# Patient Record
Sex: Female | Born: 2000 | Race: White | Hispanic: No | Marital: Single | Smoking: Never smoker
Health system: Southern US, Community
[De-identification: ages and names within clinical notes are randomized; demographics above are authoritative.]

## PROBLEM LIST (undated history)

## (undated) DIAGNOSIS — G90A Postural orthostatic tachycardia syndrome (POTS): Secondary | ICD-10-CM

## (undated) DIAGNOSIS — J45909 Unspecified asthma, uncomplicated: Secondary | ICD-10-CM

## (undated) HISTORY — DX: Unspecified asthma, uncomplicated: J45.909

## (undated) HISTORY — PX: ADENOIDECTOMY: SUR15

## (undated) HISTORY — PX: SINOSCOPY: SHX187

## (undated) HISTORY — PX: TYMPANOSTOMY TUBE PLACEMENT: SHX32

---
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
## 2021-01-23 ENCOUNTER — Ambulatory Visit (INDEPENDENT_AMBULATORY_CARE_PROVIDER_SITE_OTHER): Payer: 59

## 2021-01-23 DIAGNOSIS — J309 Allergic rhinitis, unspecified: Secondary | ICD-10-CM | POA: Diagnosis not present

## 2021-02-15 ENCOUNTER — Telehealth: Payer: Self-pay

## 2021-02-15 NOTE — Telephone Encounter (Signed)
Patient's cousin Gilman Schmidt came in to pick up vials. Due to no documentation regarding vials being picked up I had Tobi Bastos call patient. Patient stated that she spoke with someone on Wednesday and was informed that it would be ok. Patient is here for school and receives injections at our office from an outside office, patient is going back home to [place] for break and will take vials back to Allergist there to continue injections until she comes back to school.

## 2021-04-04 ENCOUNTER — Other Ambulatory Visit: Payer: Self-pay

## 2021-04-04 ENCOUNTER — Ambulatory Visit (HOSPITAL_COMMUNITY)
Admission: EM | Admit: 2021-04-04 | Discharge: 2021-04-04 | Disposition: A | Discharge status: Home / Self Care | Payer: 59 | Attending: Family Medicine | Admitting: Family Medicine

## 2021-04-04 DIAGNOSIS — R109 Unspecified abdominal pain: Secondary | ICD-10-CM

## 2021-04-04 LAB — POCT URINALYSIS DIPSTICK, ED / UC
Bilirubin Urine: NEGATIVE
Glucose, UA: NEGATIVE mg/dL
Ketones, ur: NEGATIVE mg/dL
Nitrite: NEGATIVE
Protein, ur: NEGATIVE mg/dL
Specific Gravity, Urine: 1.025 (ref 1.005–1.030)
Urobilinogen, UA: 0.2 mg/dL (ref 0.0–1.0)
pH: 7 (ref 5.0–8.0)

## 2021-04-04 LAB — POC URINE PREG, ED: Preg Test, Ur: NEGATIVE

## 2021-04-04 NOTE — ED Triage Notes (Signed)
Pt presents to urgent care for left side abdominal pain x 3 days.

## 2021-04-05 NOTE — ED Provider Notes (Signed)
[facility]    CSN: [id number] Arrival date & time: 04/04/21  1918      History   Chief Complaint Chief Complaint  Patient presents with   Abdominal Pain    HPI Tracey Harrison is a 21 y.o. female. She reports abdominal pain for the last 3 days. Mostly it is LLQ but she reports the pain "moves around" and is sometimes in RLQ, LUQ, L shoulder or L back. Pain is sharp, will abruptly onset, last for a few seconds and then recede. She is in the process of a workup for endometriosis and has a pelvic US scheduled next week. Reports nausea but no vomiting. Denies fever or chills. LMP was last week, no vaginal bleeding or discharge now. Had similar symptoms previously but they were limited to RLQ and turned out to be herniated discs. No numbness, weakness, tingling.    Abdominal Pain Associated symptoms: diarrhea and nausea   Associated symptoms: no chills, no dysuria, no fever, no vaginal bleeding, no vaginal discharge and no vomiting    Past Medical History:  Diagnosis Date   Asthma     Patient Active Problem List   Diagnosis Date Noted   Mild intermittent asthma without complication 10/16/2020   Other allergic rhinitis 10/16/2020   Allergic conjunctivitis of both eyes 10/16/2020   Other adverse food reactions, not elsewhere classified, subsequent encounter 10/16/2020   History of eczema as a child 10/16/2020   History of urticaria 10/16/2020    Past Surgical History:  Procedure Laterality Date   ADENOIDECTOMY     SINOSCOPY     TYMPANOSTOMY TUBE PLACEMENT      OB History   No obstetric history on file.      Home Medications    Prior to Admission medications   Medication Sig Start Date End Date Taking? Authorizing Provider  albuterol (VENTOLIN HFA) 108 (90 Base) MCG/ACT inhaler Inhale 2 puffs into the lungs every 4 (four) hours as needed for wheezing or shortness of breath (coughing fits). 10/15/20   Ellamae Sia, DO  cetirizine (ZYRTEC) 10 MG tablet Take 10 mg  by mouth daily.    [provider]  EPINEPHrine 0.3 mg/0.3 mL IJ SOAJ injection INJECT 1 PEN INTO THE MUSCLE AS NEEDED ANAPHYLAXIS 10/04/19   [provider]  fluticasone (FLONASE) 50 MCG/ACT nasal spray Place into the nose. 07/23/19   [provider]  hydrOXYzine (ATARAX/VISTARIL) 50 MG tablet Take by mouth. 05/05/19   [provider]  meloxicam (MOBIC) 15 MG tablet Take by mouth. 10/10/19   [provider]  montelukast (SINGULAIR) 10 MG tablet Take by mouth.    [provider]  naproxen (NAPROSYN) 500 MG tablet  07/23/19   [provider]  ondansetron (ZOFRAN-ODT) 4 MG disintegrating tablet Take by mouth. 01/22/17   [provider]  sertraline (ZOLOFT) 100 MG tablet Take by mouth. 04/13/19   [provider]  tiZANidine (ZANAFLEX) 4 MG tablet Take by mouth. 10/24/19   [provider]  tranexamic acid (LYSTEDA) 650 MG TABS tablet Take by mouth. 04/21/19   [provider]    Family History No family history on file.  Social History Social History   Tobacco Use   Smoking status: Never   Smokeless tobacco: Never  Vaping Use   Vaping Use: Never used     Allergies   Monosodium glutamate, Influenza vaccines, Penicillins, Rice, Varicella virus vaccine live, Lactase-lactobacillus, and Pollen extract   Review of Systems Review of Systems  Constitutional:  Negative for chills and fever.  Gastrointestinal:  Positive for abdominal pain, diarrhea and nausea. Negative for blood in stool and vomiting.  Genitourinary:  Negative for dysuria, flank pain, vaginal bleeding and vaginal discharge.    Physical Exam Triage Vital Signs ED Triage Vitals [04/04/21 1943]  Enc Vitals Group     BP 116/84     Pulse Rate 95     Resp 16     Temp 98.9 F (37.2 C)     Temp Source Oral     SpO2 95 %     Weight      Height      Head Circumference      Peak Flow      Pain Score 7     Pain Loc      Pain Edu?       Excl. in GC?    No data found.  Updated Vital Signs BP 116/84 (BP Location: Left Arm)    Pulse 95    Temp 98.9 F (37.2 C) (Oral)    Resp 16    SpO2 95%   Visual Acuity Right Eye Distance:   Left Eye Distance:   Bilateral Distance:    Right Eye Near:   Left Eye Near:    Bilateral Near:     Physical Exam Constitutional:      General: She is not in acute distress.    Appearance: She is well-developed. She is not ill-appearing.  Cardiovascular:     Rate and Rhythm: Normal rate and regular rhythm.  Pulmonary:     Effort: Pulmonary effort is normal.     Breath sounds: Normal breath sounds.  Abdominal:     General: Abdomen is flat.     Tenderness: There is abdominal tenderness in the right lower quadrant, suprapubic area, left upper quadrant and left lower quadrant. There is no right CVA tenderness, left CVA tenderness, guarding or rebound.  Neurological:     Mental Status: She is alert.     UC Treatments / Results  Labs (all labs ordered are listed, but only abnormal results are displayed) Labs Reviewed  POCT URINALYSIS DIPSTICK, ED / UC - Abnormal; Notable for the following components:      Result Value   Hgb urine dipstick TRACE (*)    Leukocytes,Ua TRACE (*)    All other components within normal limits  URINE CULTURE  POC URINE PREG, ED    EKG   Radiology No results found.  Procedures Procedures (including critical care time)  Medications Ordered in UC Medications - No data to display  Initial Impression / Assessment and Plan / UC Course  I have reviewed the triage vital signs and the nursing notes.  Pertinent labs & imaging results that were available during my care of the patient were reviewed by me and considered in my medical decision making (see chart for details).    Pt most comfortable in supine position with legs hanging off end of exam table. No evidence for acute abd at this time. Reviewed red flags with pt and reasons for seeking ER care.  Continue to f/u with gyn and follow through with endometriosis work up  Final Clinical Impressions(s) / UC Diagnoses   Final diagnoses:  Abdominal pain, unspecified abdominal location   Discharge Instructions   None    ED Prescriptions   None    PDMP not reviewed this encounter.   Cathlyn Parsons, NP 04/05/21 1055

## 2021-04-06 LAB — URINE CULTURE

## 2021-04-09 ENCOUNTER — Ambulatory Visit: Payer: Self-pay

## 2021-04-09 NOTE — Progress Notes (Signed)
Patient is going to reach out to her allergist and have them mailed more vials.

## 2021-06-06 ENCOUNTER — Encounter (HOSPITAL_COMMUNITY)
Admission: EM | Disposition: A | Discharge status: Home / Self Care | Payer: Self-pay | Source: Home / Self Care | Attending: Emergency Medicine

## 2021-06-06 ENCOUNTER — Emergency Department (HOSPITAL_COMMUNITY): Payer: 59

## 2021-06-06 ENCOUNTER — Other Ambulatory Visit: Payer: Self-pay

## 2021-06-06 ENCOUNTER — Emergency Department (HOSPITAL_BASED_OUTPATIENT_CLINIC_OR_DEPARTMENT_OTHER): Payer: 59 | Admitting: Anesthesiology

## 2021-06-06 ENCOUNTER — Encounter (HOSPITAL_COMMUNITY): Payer: Self-pay | Admitting: Emergency Medicine

## 2021-06-06 ENCOUNTER — Observation Stay (HOSPITAL_COMMUNITY)
Admission: EM | Admit: 2021-06-06 | Discharge: 2021-06-07 | Disposition: A | Discharge status: Home / Self Care | Payer: 59 | Attending: Emergency Medicine | Admitting: Emergency Medicine

## 2021-06-06 ENCOUNTER — Emergency Department (HOSPITAL_COMMUNITY): Payer: 59 | Admitting: Anesthesiology

## 2021-06-06 DIAGNOSIS — K3580 Unspecified acute appendicitis: Secondary | ICD-10-CM | POA: Diagnosis present

## 2021-06-06 DIAGNOSIS — R1031 Right lower quadrant pain: Secondary | ICD-10-CM | POA: Diagnosis present

## 2021-06-06 DIAGNOSIS — J45909 Unspecified asthma, uncomplicated: Secondary | ICD-10-CM | POA: Diagnosis not present

## 2021-06-06 DIAGNOSIS — K3589 Other acute appendicitis without perforation or gangrene: Principal | ICD-10-CM | POA: Insufficient documentation

## 2021-06-06 DIAGNOSIS — Z9049 Acquired absence of other specified parts of digestive tract: Secondary | ICD-10-CM

## 2021-06-06 HISTORY — PX: LAPAROSCOPIC APPENDECTOMY: SHX408

## 2021-06-06 HISTORY — DX: Postural orthostatic tachycardia syndrome (POTS): G90.A

## 2021-06-06 LAB — CBC
HCT: 42 % (ref 36.0–46.0)
Hemoglobin: 14 g/dL (ref 12.0–15.0)
MCH: 28.1 pg (ref 26.0–34.0)
MCHC: 33.3 g/dL (ref 30.0–36.0)
MCV: 84.3 fL (ref 80.0–100.0)
Platelets: 350 10*3/uL (ref 150–400)
RBC: 4.98 MIL/uL (ref 3.87–5.11)
RDW: 12.5 % (ref 11.5–15.5)
WBC: 18.1 10*3/uL — ABNORMAL HIGH (ref 4.0–10.5)
nRBC: 0 % (ref 0.0–0.2)

## 2021-06-06 LAB — URINALYSIS, ROUTINE W REFLEX MICROSCOPIC
Bilirubin Urine: NEGATIVE
Glucose, UA: NEGATIVE mg/dL
Ketones, ur: 5 mg/dL — AB
Nitrite: NEGATIVE
Protein, ur: NEGATIVE mg/dL
Specific Gravity, Urine: 1.027 (ref 1.005–1.030)
pH: 6 (ref 5.0–8.0)

## 2021-06-06 LAB — HIV ANTIBODY (ROUTINE TESTING W REFLEX): HIV Screen 4th Generation wRfx: NONREACTIVE

## 2021-06-06 LAB — COMPREHENSIVE METABOLIC PANEL
ALT: 23 U/L (ref 0–44)
AST: 22 U/L (ref 15–41)
Albumin: 3.9 g/dL (ref 3.5–5.0)
Alkaline Phosphatase: 49 U/L (ref 38–126)
Anion gap: 11 (ref 5–15)
BUN: 9 mg/dL (ref 6–20)
CO2: 20 mmol/L — ABNORMAL LOW (ref 22–32)
Calcium: 9.4 mg/dL (ref 8.9–10.3)
Chloride: 104 mmol/L (ref 98–111)
Creatinine, Ser: 0.87 mg/dL (ref 0.44–1.00)
GFR, Estimated: >60 mL/min (ref >60)
Glucose, Bld: 153 mg/dL — ABNORMAL HIGH (ref 70–99)
Potassium: 3.3 mmol/L — ABNORMAL LOW (ref 3.5–5.1)
Sodium: 135 mmol/L (ref 135–145)
Total Bilirubin: 0.7 mg/dL (ref 0.3–1.2)
Total Protein: 6.8 g/dL (ref 6.5–8.1)

## 2021-06-06 LAB — I-STAT BETA HCG BLOOD, ED (MC, WL, AP ONLY): I-stat hCG, quantitative: <5 m[IU]/mL (ref <5)

## 2021-06-06 LAB — LIPASE, BLOOD: Lipase: 30 U/L (ref 11–51)

## 2021-06-06 SURGERY — APPENDECTOMY, LAPAROSCOPIC
Anesthesia: General

## 2021-06-06 MED ORDER — DIPHENHYDRAMINE HCL 50 MG/ML IJ SOLN: 25.0000 mg | Freq: Four times a day (QID) | INTRAMUSCULAR | Status: DC | PRN

## 2021-06-06 MED ORDER — FENTANYL CITRATE (PF) 250 MCG/5ML IJ SOLN
INTRAMUSCULAR | Status: AC
Start: 2021-06-06 — End: ?
  Filled 2021-06-06: qty 5

## 2021-06-06 MED ORDER — PHENYLEPHRINE 40 MCG/ML (10ML) SYRINGE FOR IV PUSH (FOR BLOOD PRESSURE SUPPORT)
PREFILLED_SYRINGE | INTRAVENOUS | Status: DC | PRN
Start: 2021-06-06 — End: 2021-06-06
  Administered 2021-06-06: 120 ug via INTRAVENOUS
  Administered 2021-06-06: 80 ug via INTRAVENOUS

## 2021-06-06 MED ORDER — IOHEXOL 300 MG/ML  SOLN
100.0000 mL | Freq: Once | INTRAMUSCULAR | Status: AC | PRN
  Administered 2021-06-06: 100 mL via INTRAVENOUS

## 2021-06-06 MED ORDER — ROCURONIUM BROMIDE 10 MG/ML (PF) SYRINGE
PREFILLED_SYRINGE | INTRAVENOUS | Status: DC | PRN
Start: 2021-06-06 — End: 2021-06-06
  Administered 2021-06-06: 50 mg via INTRAVENOUS

## 2021-06-06 MED ORDER — POLYETHYLENE GLYCOL 3350 17 G PO PACK: 17.0000 g | PACK | Freq: Every day | ORAL | Status: DC | PRN

## 2021-06-06 MED ORDER — ONDANSETRON HCL 4 MG/2ML IJ SOLN
4.0000 mg | Freq: Once | INTRAMUSCULAR | Status: AC | PRN
  Administered 2021-06-06: 4 mg via INTRAVENOUS
  Filled 2021-06-06: qty 2

## 2021-06-06 MED ORDER — SODIUM CHLORIDE 0.9 % IR SOLN
Status: DC | PRN
  Administered 2021-06-06: 1000 mL

## 2021-06-06 MED ORDER — GLYCOPYRROLATE 0.2 MG/ML IJ SOLN
INTRAMUSCULAR | Status: DC | PRN
  Administered 2021-06-06: .1 mg via INTRAVENOUS

## 2021-06-06 MED ORDER — ONDANSETRON HCL 4 MG/2ML IJ SOLN
INTRAMUSCULAR | Status: DC | PRN
  Administered 2021-06-06: 4 mg via INTRAVENOUS

## 2021-06-06 MED ORDER — MORPHINE SULFATE (PF) 4 MG/ML IV SOLN
4.0000 mg | Freq: Once | INTRAVENOUS | Status: AC
  Administered 2021-06-06: 4 mg via INTRAVENOUS
  Filled 2021-06-06: qty 1

## 2021-06-06 MED ORDER — CHLORHEXIDINE GLUCONATE 0.12 % MT SOLN
OROMUCOSAL | Status: AC
  Administered 2021-06-06: 15 mL via OROMUCOSAL
  Filled 2021-06-06: qty 15

## 2021-06-06 MED ORDER — DEXMEDETOMIDINE (PRECEDEX) IN NS 20 MCG/5ML (4 MCG/ML) IV SYRINGE
PREFILLED_SYRINGE | INTRAVENOUS | Status: DC | PRN
  Administered 2021-06-06: 20 ug via INTRAVENOUS

## 2021-06-06 MED ORDER — PROPOFOL 10 MG/ML IV BOLUS
INTRAVENOUS | Status: DC | PRN
  Administered 2021-06-06: 160 mg via INTRAVENOUS

## 2021-06-06 MED ORDER — ONDANSETRON HCL 4 MG/2ML IJ SOLN: 4.0000 mg | Freq: Four times a day (QID) | INTRAMUSCULAR | Status: DC | PRN

## 2021-06-06 MED ORDER — SUCCINYLCHOLINE CHLORIDE 200 MG/10ML IV SOSY
PREFILLED_SYRINGE | INTRAVENOUS | Status: DC | PRN
  Administered 2021-06-06: 100 mg via INTRAVENOUS

## 2021-06-06 MED ORDER — DIPHENHYDRAMINE HCL 25 MG PO CAPS: 25.0000 mg | ORAL_CAPSULE | Freq: Four times a day (QID) | ORAL | Status: DC | PRN

## 2021-06-06 MED ORDER — PHENYLEPHRINE HCL-NACL 20-0.9 MG/250ML-% IV SOLN
INTRAVENOUS | Status: DC | PRN
  Administered 2021-06-06: 40 ug/min via INTRAVENOUS

## 2021-06-06 MED ORDER — DOCUSATE SODIUM 100 MG PO CAPS
100.0000 mg | ORAL_CAPSULE | Freq: Two times a day (BID) | ORAL | Status: DC
  Administered 2021-06-06: 100 mg via ORAL
  Filled 2021-06-06 (×2): qty 1

## 2021-06-06 MED ORDER — MORPHINE SULFATE (PF) 2 MG/ML IV SOLN: 2.0000 mg | INTRAVENOUS | Status: DC | PRN

## 2021-06-06 MED ORDER — ONDANSETRON 4 MG PO TBDP: 4.0000 mg | ORAL_TABLET | Freq: Four times a day (QID) | ORAL | Status: DC | PRN

## 2021-06-06 MED ORDER — HYDRALAZINE HCL 20 MG/ML IJ SOLN: 10.0000 mg | INTRAMUSCULAR | Status: DC | PRN

## 2021-06-06 MED ORDER — LEVOCETIRIZINE DIHYDROCHLORIDE 5 MG PO TABS: 5.0000 mg | ORAL_TABLET | Freq: Every evening | ORAL | Status: DC

## 2021-06-06 MED ORDER — FLUTICASONE PROPIONATE 50 MCG/ACT NA SUSP
2.0000 | Freq: Every day | NASAL | Status: DC
  Administered 2021-06-07: 2 via NASAL
  Filled 2021-06-06: qty 16

## 2021-06-06 MED ORDER — MIDAZOLAM HCL 2 MG/2ML IJ SOLN
INTRAMUSCULAR | Status: AC
  Filled 2021-06-06: qty 2

## 2021-06-06 MED ORDER — POTASSIUM CHLORIDE IN NACL 20-0.9 MEQ/L-% IV SOLN
INTRAVENOUS | Status: DC
Start: 2021-06-06 — End: 2021-06-07
  Filled 2021-06-06 (×3): qty 1000

## 2021-06-06 MED ORDER — OXYCODONE HCL 5 MG PO TABS
5.0000 mg | ORAL_TABLET | ORAL | Status: DC | PRN
  Administered 2021-06-06 – 2021-06-07 (×4): 10 mg via ORAL
  Filled 2021-06-06 (×4): qty 2

## 2021-06-06 MED ORDER — METHOCARBAMOL 500 MG PO TABS
500.0000 mg | ORAL_TABLET | Freq: Four times a day (QID) | ORAL | Status: DC | PRN
  Filled 2021-06-06: qty 1

## 2021-06-06 MED ORDER — BUPIVACAINE HCL (PF) 0.25 % IJ SOLN
INTRAMUSCULAR | Status: DC | PRN
  Administered 2021-06-06: 20 mL

## 2021-06-06 MED ORDER — SERTRALINE HCL 100 MG PO TABS
100.0000 mg | ORAL_TABLET | Freq: Every day | ORAL | Status: DC
  Administered 2021-06-06 – 2021-06-07 (×2): 100 mg via ORAL
  Filled 2021-06-06 (×2): qty 1

## 2021-06-06 MED ORDER — MELATONIN 3 MG PO TABS: 3.0000 mg | ORAL_TABLET | Freq: Every evening | ORAL | Status: DC | PRN

## 2021-06-06 MED ORDER — SODIUM CHLORIDE 0.9 % IV SOLN: INTRAVENOUS | Status: DC

## 2021-06-06 MED ORDER — CIPROFLOXACIN IN D5W 400 MG/200ML IV SOLN
400.0000 mg | INTRAVENOUS | Status: AC
  Administered 2021-06-06: 400 mg via INTRAVENOUS
  Filled 2021-06-06: qty 200

## 2021-06-06 MED ORDER — FENTANYL CITRATE (PF) 250 MCG/5ML IJ SOLN
INTRAMUSCULAR | Status: DC | PRN
  Administered 2021-06-06: 100 ug via INTRAVENOUS
  Administered 2021-06-06: 50 ug via INTRAVENOUS

## 2021-06-06 MED ORDER — ACETAMINOPHEN 500 MG PO TABS
1000.0000 mg | ORAL_TABLET | ORAL | Status: AC
  Administered 2021-06-06: 1000 mg via ORAL
  Filled 2021-06-06: qty 2

## 2021-06-06 MED ORDER — MORPHINE SULFATE (PF) 2 MG/ML IV SOLN
2.0000 mg | INTRAVENOUS | Status: DC | PRN
  Administered 2021-06-06: 2 mg via INTRAVENOUS
  Filled 2021-06-06: qty 1

## 2021-06-06 MED ORDER — LACTATED RINGERS IV SOLN: INTRAVENOUS | Status: DC

## 2021-06-06 MED ORDER — FAMOTIDINE 20 MG PO TABS
40.0000 mg | ORAL_TABLET | Freq: Two times a day (BID) | ORAL | Status: DC
  Administered 2021-06-06 – 2021-06-07 (×2): 40 mg via ORAL
  Filled 2021-06-06 (×2): qty 2

## 2021-06-06 MED ORDER — PROCHLORPERAZINE MALEATE 10 MG PO TABS
10.0000 mg | ORAL_TABLET | Freq: Four times a day (QID) | ORAL | Status: DC | PRN
  Filled 2021-06-06: qty 1

## 2021-06-06 MED ORDER — POTASSIUM CHLORIDE 10 MEQ/100ML IV SOLN
10.0000 meq | INTRAVENOUS | Status: AC
  Administered 2021-06-06 (×2): 10 meq via INTRAVENOUS
  Filled 2021-06-06 (×2): qty 100

## 2021-06-06 MED ORDER — LORATADINE 10 MG PO TABS
10.0000 mg | ORAL_TABLET | Freq: Every day | ORAL | Status: DC
Start: 2021-06-06 — End: 2021-06-07
  Administered 2021-06-06 – 2021-06-07 (×2): 10 mg via ORAL
  Filled 2021-06-06 (×2): qty 1

## 2021-06-06 MED ORDER — ORAL CARE MOUTH RINSE: 15.0000 mL | Freq: Once | OROMUCOSAL | Status: AC

## 2021-06-06 MED ORDER — SIMETHICONE 80 MG PO CHEW
40.0000 mg | CHEWABLE_TABLET | Freq: Four times a day (QID) | ORAL | Status: DC | PRN
Start: 2021-06-06 — End: 2021-06-07

## 2021-06-06 MED ORDER — ACETAMINOPHEN 500 MG PO TABS
1000.0000 mg | ORAL_TABLET | Freq: Three times a day (TID) | ORAL | Status: DC
Start: 2021-06-06 — End: 2021-06-07
  Administered 2021-06-06 – 2021-06-07 (×2): 1000 mg via ORAL
  Filled 2021-06-06 (×2): qty 2

## 2021-06-06 MED ORDER — SODIUM CHLORIDE 0.9 % IV BOLUS
1000.0000 mL | Freq: Once | INTRAVENOUS | Status: AC
  Administered 2021-06-06: 1000 mL via INTRAVENOUS

## 2021-06-06 MED ORDER — DEXAMETHASONE SODIUM PHOSPHATE 10 MG/ML IJ SOLN
INTRAMUSCULAR | Status: DC | PRN
Start: 2021-06-06 — End: 2021-06-06
  Administered 2021-06-06: 10 mg via INTRAVENOUS

## 2021-06-06 MED ORDER — BUPIVACAINE HCL (PF) 0.25 % IJ SOLN
INTRAMUSCULAR | Status: AC
  Filled 2021-06-06: qty 30

## 2021-06-06 MED ORDER — SUGAMMADEX SODIUM 200 MG/2ML IV SOLN
INTRAVENOUS | Status: DC | PRN
  Administered 2021-06-06: 400 mg via INTRAVENOUS

## 2021-06-06 MED ORDER — 0.9 % SODIUM CHLORIDE (POUR BTL) OPTIME
TOPICAL | Status: DC | PRN
  Administered 2021-06-06: 1000 mL

## 2021-06-06 MED ORDER — METRONIDAZOLE 500 MG/100ML IV SOLN
500.0000 mg | INTRAVENOUS | Status: AC
  Administered 2021-06-06: 500 mg via INTRAVENOUS
  Filled 2021-06-06: qty 100

## 2021-06-06 MED ORDER — CHLORHEXIDINE GLUCONATE 0.12 % MT SOLN: 15.0000 mL | Freq: Once | OROMUCOSAL | Status: AC

## 2021-06-06 MED ORDER — CHLORHEXIDINE GLUCONATE CLOTH 2 % EX PADS: 6.0000 | MEDICATED_PAD | Freq: Once | CUTANEOUS | Status: DC

## 2021-06-06 MED ORDER — LIDOCAINE 2% (20 MG/ML) 5 ML SYRINGE
INTRAMUSCULAR | Status: DC | PRN
  Administered 2021-06-06: 40 mg via INTRAVENOUS

## 2021-06-06 MED ORDER — PANTOPRAZOLE SODIUM 40 MG IV SOLR
40.0000 mg | Freq: Every day | INTRAVENOUS | Status: DC
  Administered 2021-06-06: 40 mg via INTRAVENOUS
  Filled 2021-06-06: qty 10

## 2021-06-06 MED ORDER — ALBUTEROL SULFATE (2.5 MG/3ML) 0.083% IN NEBU: 2.5000 mg | INHALATION_SOLUTION | RESPIRATORY_TRACT | Status: DC | PRN

## 2021-06-06 MED ORDER — PROCHLORPERAZINE EDISYLATE 10 MG/2ML IJ SOLN: 5.0000 mg | Freq: Four times a day (QID) | INTRAMUSCULAR | Status: DC | PRN

## 2021-06-06 SURGICAL SUPPLY — 42 items
APPLIER CLIP 5 13 M/L LIGAMAX5 (MISCELLANEOUS) ×2
BAG COUNTER SPONGE SURGICOUNT (BAG) ×2 IMPLANT
CANISTER SUCT 3000ML PPV (MISCELLANEOUS) ×2 IMPLANT
CHLORAPREP W/TINT 26 (MISCELLANEOUS) ×2 IMPLANT
CLIP APPLIE 5 13 M/L LIGAMAX5 (MISCELLANEOUS) IMPLANT
COVER SURGICAL LIGHT HANDLE (MISCELLANEOUS) ×2 IMPLANT
CUTTER FLEX LINEAR 45M (STAPLE) ×2 IMPLANT
DERMABOND ADVANCED (GAUZE/BANDAGES/DRESSINGS) ×1
DERMABOND ADVANCED .7 DNX12 (GAUZE/BANDAGES/DRESSINGS) ×1 IMPLANT
ELECT REM PT RETURN 9FT ADLT (ELECTROSURGICAL) ×2
ELECTRODE REM PT RTRN 9FT ADLT (ELECTROSURGICAL) ×1 IMPLANT
GAUZE 4X4 16PLY ~~LOC~~+RFID DBL (SPONGE) ×1 IMPLANT
GLOVE SURG ENC MOIS LTX SZ7.5 (GLOVE) ×2 IMPLANT
GLOVE SURG UNDER LTX SZ8 (GLOVE) ×2 IMPLANT
GOWN STRL REUS W/ TWL LRG LVL3 (GOWN DISPOSABLE) ×2 IMPLANT
GOWN STRL REUS W/ TWL XL LVL3 (GOWN DISPOSABLE) ×1 IMPLANT
GOWN STRL REUS W/TWL LRG LVL3 (GOWN DISPOSABLE) ×2
GOWN STRL REUS W/TWL XL LVL3 (GOWN DISPOSABLE) ×1
GRASPER SUT TROCAR 14GX15 (MISCELLANEOUS) ×2 IMPLANT
KIT BASIN OR (CUSTOM PROCEDURE TRAY) ×2 IMPLANT
KIT TURNOVER KIT B (KITS) ×2 IMPLANT
NDL INSUFFLATION 14GA 120MM (NEEDLE) ×1 IMPLANT
NEEDLE INSUFFLATION 14GA 120MM (NEEDLE) ×2 IMPLANT
NS IRRIG 1000ML POUR BTL (IV SOLUTION) ×2 IMPLANT
PAD ARMBOARD 7.5X6 YLW CONV (MISCELLANEOUS) ×4 IMPLANT
POUCH SPECIMEN RETRIEVAL 10MM (ENDOMECHANICALS) ×2 IMPLANT
RELOAD 45 VASCULAR/THIN (ENDOMECHANICALS) ×2 IMPLANT
RELOAD STAPLE 45 2.5 WHT GRN (ENDOMECHANICALS) IMPLANT
SET IRRIG TUBING LAPAROSCOPIC (IRRIGATION / IRRIGATOR) ×2 IMPLANT
SET TUBE SMOKE EVAC HIGH FLOW (TUBING) ×2 IMPLANT
SHEARS HARMONIC ACE PLUS 36CM (ENDOMECHANICALS) ×2 IMPLANT
SLEEVE ENDOPATH XCEL 5M (ENDOMECHANICALS) ×2 IMPLANT
SPECIMEN JAR SMALL (MISCELLANEOUS) ×2 IMPLANT
SPONGE T-LAP 18X18 ~~LOC~~+RFID (SPONGE) ×1 IMPLANT
SUT MNCRL AB 4-0 PS2 18 (SUTURE) ×3 IMPLANT
TOWEL GREEN STERILE FF (TOWEL DISPOSABLE) ×2 IMPLANT
TRAY FOLEY W/BAG SLVR 16FR (SET/KITS/TRAYS/PACK) ×1
TRAY FOLEY W/BAG SLVR 16FR ST (SET/KITS/TRAYS/PACK) ×1 IMPLANT
TRAY LAPAROSCOPIC MC (CUSTOM PROCEDURE TRAY) ×2 IMPLANT
TROCAR XCEL 12X100 BLDLESS (ENDOMECHANICALS) ×2 IMPLANT
TROCAR XCEL NON-BLD 5MMX100MML (ENDOMECHANICALS) ×2 IMPLANT
WATER STERILE IRR 1000ML POUR (IV SOLUTION) ×2 IMPLANT

## 2021-06-06 NOTE — ED Notes (Signed)
Consent signed and RN witnessed  ?

## 2021-06-06 NOTE — Transfer of Care (Signed)
Immediate Anesthesia Transfer of Care Note ? ?Patient: Cyana Nagarajan ? ?Procedure(s) Performed: APPENDECTOMY LAPAROSCOPIC ? ?Patient Location: PACU ? ?Anesthesia Type:General ? ?Level of Consciousness: drowsy ? ?Airway & Oxygen Therapy: Patient Spontanous Breathing ? ?Post-op Assessment: Report given to RN and Post -op Vital signs reviewed and stable ? ?Post vital signs: Reviewed and stable ? ?Last Vitals:  ?Vitals Value Taken Time  ?BP 105/57 06/06/21 1630  ?Temp 36.5 ?C 06/06/21 1630  ?Pulse 89 06/06/21 1638  ?Resp 27 06/06/21 1638  ?SpO2 97 % 06/06/21 1638  ?Vitals shown include unvalidated device data. ? ?Last Pain:  ?Vitals:  ? 06/06/21 1630  ?TempSrc:   ?PainSc: Asleep  ?   ? ?Patients Stated Pain Goal: 2 (06/06/21 1344) ? ?Complications: No notable events documented. ?

## 2021-06-06 NOTE — Discharge Instructions (Addendum)
[facility] ? ?Please arrive at least 30 min before your appointment to complete your check in paperwork.  If you are unable to arrive 30 min prior to your appointment time we may have to cancel or reschedule you. ?LAPAROSCOPIC SURGERY: POST OP INSTRUCTIONS ?Always review your discharge instruction sheet given to you by the facility where your surgery was performed. ?IF YOU HAVE DISABILITY OR FAMILY LEAVE FORMS, YOU MUST BRING THEM TO THE OFFICE FOR PROCESSING.   ?DO NOT GIVE THEM TO YOUR DOCTOR. ? ?PAIN CONTROL ? ?First take acetaminophen (Tylenol) AND/or ibuprofen (Advil) to control your pain after surgery.  Follow directions on package.  Taking acetaminophen (Tylenol) and/or ibuprofen (Advil) regularly after surgery will help to control your pain and lower the amount of prescription pain medication you may need.  You should not take more than 4,000 mg (4 grams) of acetaminophen (Tylenol) in 24 hours.  You should not take ibuprofen (Advil), aleve, motrin, naprosyn or other NSAIDS if you have a history of stomach ulcers or chronic kidney disease.  ?A prescription for pain medication may be given to you upon discharge.  Take your pain medication as prescribed, if you still have uncontrolled pain after taking acetaminophen (Tylenol) or ibuprofen (Advil). ?Use ice packs to help control pain. ?If you need a refill on your pain medication, please contact your pharmacy.  They will contact our office to request authorization. Prescriptions will not be filled after 5pm or on week-ends. ? ?HOME MEDICATIONS ?Take your usually prescribed medications unless otherwise directed. ? ?DIET ?You should follow a light diet the first few days after arrival home.  Be sure to include lots of fluids daily. Avoid fatty, fried foods.  ? ?CONSTIPATION ?It is common to experience some constipation after surgery and if you are taking pain medication.  Increasing fluid intake and taking a stool softener (such as Colace)  will usually help or prevent this problem from occurring.  A mild laxative (Milk of Magnesia or Miralax) should be taken according to package instructions if there are no bowel movements after 48 hours. ? ?WOUND/INCISION CARE ?Most patients will experience some swelling and bruising in the area of the incisions.  Ice packs will help.  Swelling and bruising can take several days to resolve.  ?Unless discharge instructions indicate otherwise, follow guidelines below  ?STERI-STRIPS - you may remove your outer bandages 48 hours after surgery, and you may shower at that time.  You have steri-strips (small skin tapes) in place directly over the incision.  These strips should be left on the skin for 7-10 days.   ?DERMABOND/SKIN GLUE - you may shower in 24 hours.  The glue will flake off over the next 2-3 weeks. ?Any sutures or staples will be removed at the office during your follow-up visit. ? ?ACTIVITIES ?You may resume regular (light) daily activities beginning the next day--such as daily self-care, walking, climbing stairs--gradually increasing activities as tolerated.  You may have sexual intercourse when it is comfortable.  Refrain from any heavy lifting or straining until approved by your doctor. ?You may drive when you are no longer taking prescription pain medication, you can comfortably wear a seatbelt, and you can safely maneuver your car and apply brakes. ? ?FOLLOW-UP ?You should see your doctor in the office for a follow-up appointment approximately 2-3 weeks after your surgery.  You should have been given your post-op/follow-up appointment when your surgery was scheduled.  If you did not receive a post-op/follow-up appointment, make sure   that you call for this appointment within a day or two after you arrive home to insure a convenient appointment time. ? ?OTHER INSTRUCTIONS ? ?WHEN TO CALL YOUR DOCTOR: ?Fever over 101.0 ?Inability to urinate ?Continued bleeding from incision. ?Increased pain, redness, or  drainage from the incision. ?Increasing abdominal pain ? ?The clinic staff is available to answer your questions during regular business hours.  Please don?t hesitate to call and ask to speak to one of the nurses for clinical concerns.  If you have a medical emergency, go to the nearest emergency room or call 911.  A surgeon from [facility] is always on call at the hospital. ?[address], [address], [place], [place]  [address] ? [address] [address], [place], [place]   [address] ?[phone] ? [phone] ? FAX [phone] ? ? ? ? ?Managing Your Pain After Surgery Without Opioids ? ? ? ?Thank you for participating in our program to help patients manage their pain after surgery without opioids. This is part of our effort to provide you with the best care possible, without exposing you or your family to the risk that opioids pose. ? ?What pain can I expect after surgery? ?You can expect to have some pain after surgery. This is normal. The pain is typically worse the day after surgery, and quickly begins to get better. ?Many studies have found that many patients are able to manage their pain after surgery with Over-the-Counter (OTC) medications such as Tylenol and Motrin. If you have a condition that does not allow you to take Tylenol or Motrin, notify your surgical team. ? ?How will I manage my pain? ?The best strategy for controlling your pain after surgery is around the clock pain control with Tylenol (acetaminophen) and Motrin (ibuprofen or Advil). Alternating these medications with each other allows you to maximize your pain control. In addition to Tylenol and Motrin, you can use heating pads or ice packs on your incisions to help reduce your pain. ? ?How will I alternate your regular strength over-the-counter pain medication? ?You will take a dose of pain medication every three hours. ?Start by taking 650 mg of Tylenol (2 pills of 325 mg) ?3 hours later take 600 mg of Motrin (3 pills of 200  mg) ?3 hours after taking the Motrin take 650 mg of Tylenol ?3 hours after that take 600 mg of Motrin. ? ? ?- 1 - ? ?See example - if your first dose of Tylenol is at 12:00 PM ? ? ?12:00 PM Tylenol 650 mg (2 pills of 325 mg)  ?3:00 PM Motrin 600 mg (3 pills of 200 mg)  ?6:00 PM Tylenol 650 mg (2 pills of 325 mg)  ?9:00 PM Motrin 600 mg (3 pills of 200 mg)  ?Continue alternating every 3 hours  ? ?We recommend that you follow this schedule around-the-clock for at least 3 days after surgery, or until you feel that it is no longer needed. Use the table on the last page of this handout to keep track of the medications you are taking. ?Important: ?Do not take more than 3000mg  of Tylenol or 3200mg  of Motrin in a 24-hour period. ?Do not take ibuprofen/Motrin if you have a history of bleeding stomach ulcers, severe kidney disease, &/or actively taking a blood thinner ? ?What if I still have pain? ?If you have pain that is not controlled with the over-the-counter pain medications (Tylenol and Motrin or Advil) you might have what we call ?breakthrough? pain. You will receive  a prescription for a small amount of an opioid pain medication such as Oxycodone, Tramadol, or Tylenol with Codeine. Use these opioid pills in the first 24 hours after surgery if you have breakthrough pain. Do not take more than 1 pill every 4-6 hours. ? ?If you still have uncontrolled pain after using all opioid pills, don't hesitate to call our staff using the number provided. We will help make sure you are managing your pain in the best way possible, and if necessary, we can provide a prescription for additional pain medication. ? ? ?Day 1   ? ?Time  ?Name of Medication Number of pills taken  ?Amount of Acetaminophen  ?Pain Level  ? ?Comments  ?AM PM       ?AM PM       ?AM PM       ?AM PM       ?AM PM       ?AM PM       ?AM PM       ?AM PM       ?Total Daily amount of Acetaminophen ?Do not take more than  3,000 mg per day    ? ? ?Day 2   ? ?Time  ?Name  of Medication Number of pills ?taken  ?Amount of Acetaminophen  ?Pain Level  ? ?Comments  ?AM PM       ?AM PM       ?AM PM       ?AM PM       ?AM PM       ?AM PM       ?AM PM       ?AM PM       ?Total Daily

## 2021-06-06 NOTE — Anesthesia Procedure Notes (Signed)
Procedure Name: Intubation ?Date/Time: 06/06/2021 3:27 PM ?Performed by: Vonna Drafts, CRNA ?Pre-anesthesia Checklist: Patient identified, Emergency Drugs available, Suction available and Patient being monitored ?Patient Re-evaluated:Patient Re-evaluated prior to induction ?Oxygen Delivery Method: Circle system utilized ?Preoxygenation: Pre-oxygenation with 100% oxygen ?Induction Type: IV induction, Rapid sequence and Cricoid Pressure applied ?Laryngoscope Size: Mac and 3 ?Grade View: Grade I ?Tube type: Oral ?Tube size: 7.0 mm ?Number of attempts: 1 ?Airway Equipment and Method: Stylet and Oral airway ?Placement Confirmation: ETT inserted through vocal cords under direct vision, positive ETCO2 and breath sounds checked- equal and bilateral ?Secured at: 21 cm ?Tube secured with: Tape ?Dental Injury: Teeth and Oropharynx as per pre-operative assessment  ? ? ? ? ?

## 2021-06-06 NOTE — ED Provider Notes (Signed)
?[place] ?Provider Note ? ? ?CSN: [id number] ?Arrival date & time: 06/06/21  [id number] ? ?  ? ?History ? ?Chief Complaint  ?Patient presents with  ? Abdominal Pain  ? Emesis  ? ? ?Tracey Harrison is a 21 y.o. female. With past medical history of IBS who presents to the emergency department with abdominal pain. ? ?States that at 1 AM she began having abdominal pain with nausea and vomiting.  Describes the pain is intermittent, severe.  She states that pain is epigastric but radiates to the right lower quadrant.  She states that she gets lightheaded when the pain comes on.  Has associated bilious vomiting and chills.  Denies dysuria, vaginal discharge, diarrhea.  Last menstrual period 2 months ago, on birth control.  She denies alcohol use, Tylenol or ibuprofen use, history of diabetes.  Denies previous abdominal surgeries. ? ? ?Abdominal Pain ?Associated symptoms: chills, nausea and vomiting   ?Associated symptoms: no chest pain, no cough, no diarrhea, no dysuria, no shortness of breath and no vaginal discharge   ?Emesis ?Associated symptoms: abdominal pain and chills   ?Associated symptoms: no cough and no diarrhea   ? ?  ? ?Home Medications ?Prior to Admission medications   ?Medication Sig Start Date End Date Taking? Authorizing Provider  ?albuterol (VENTOLIN HFA) 108 (90 Base) MCG/ACT inhaler Inhale 2 puffs into the lungs every 4 (four) hours as needed for wheezing or shortness of breath (coughing fits). 10/15/20   Garnet Sierras, DO  ?cetirizine (ZYRTEC) 10 MG tablet Take 10 mg by mouth daily.    [provider]  ?EPINEPHrine 0.3 mg/0.3 mL IJ SOAJ injection INJECT 1 PEN INTO THE MUSCLE AS NEEDED ANAPHYLAXIS 10/04/19   [provider]  ?fluticasone (FLONASE) 50 MCG/ACT nasal spray Place into the nose. 07/23/19   [provider]  ?hydrOXYzine (ATARAX/VISTARIL) 50 MG tablet Take by mouth. 05/05/19   [provider]  ?meloxicam (MOBIC) 15 MG tablet Take by  mouth. 10/10/19   [provider]  ?montelukast (SINGULAIR) 10 MG tablet Take by mouth.    [provider]  ?naproxen (NAPROSYN) 500 MG tablet  07/23/19   [provider]  ?ondansetron (ZOFRAN-ODT) 4 MG disintegrating tablet Take by mouth. 01/22/17   [provider]  ?sertraline (ZOLOFT) 100 MG tablet Take by mouth. 04/13/19   [provider]  ?tiZANidine (ZANAFLEX) 4 MG tablet Take by mouth. 10/24/19   [provider]  ?tranexamic acid (LYSTEDA) 650 MG TABS tablet Take by mouth. 04/21/19   [provider]  ?   ? ?Allergies    ?Monosodium glutamate, Influenza vaccines, Penicillins, Rice, Varicella virus vaccine live, Lactase-lactobacillus, and Pollen extract   ? ?Review of Systems   ?Review of Systems  ?Constitutional:  Positive for appetite change and chills.  ?Respiratory:  Negative for cough and shortness of breath.   ?Cardiovascular:  Negative for chest pain.  ?Gastrointestinal:  Positive for abdominal pain, nausea and vomiting. Negative for diarrhea.  ?Genitourinary:  Negative for dysuria and vaginal discharge.  ?All other systems reviewed and are negative. ? ?Physical Exam ?Updated Vital Signs ?BP (!) 121/100   Pulse 94   Temp 98 ?F (36.7 ?C) (Oral)   Resp 20   Ht 5\' 8"  (1.727 m)   Wt 97.1 kg   SpO2 99%   BMI 32.54 kg/m?  ?Physical Exam ?Vitals and nursing note reviewed.  ?Constitutional:   ?   General: She is not in acute distress. ?  Appearance: She is well-developed. She is ill-appearing. She is not toxic-appearing.  ?HENT:  ?   Head: Normocephalic and atraumatic.  ?   Nose: Nose normal.  ?   Mouth/Throat:  ?   Mouth: Mucous membranes are moist.  ?   Pharynx: Oropharynx is clear.  ?Eyes:  ?   General: No scleral icterus. ?   Extraocular Movements: Extraocular movements intact.  ?   Pupils: Pupils are equal, round, and reactive to light.  ?Cardiovascular:  ?   Rate and Rhythm: Normal rate and regular rhythm.  ?   Heart sounds: Normal heart sounds.  No murmur heard. ?Pulmonary:  ?   Effort: Pulmonary effort is normal. No respiratory distress.  ?Abdominal:  ?   General: Abdomen is protuberant. Bowel sounds are normal. There is no distension.  ?   Palpations: Abdomen is soft.  ?   Tenderness: There is abdominal tenderness in the right lower quadrant. There is no right CVA tenderness or left CVA tenderness. Positive signs include Rovsing's sign and McBurney's sign.  ?Musculoskeletal:     ?   General: Normal range of motion.  ?   Cervical back: Normal range of motion and neck supple.  ?Skin: ?   General: Skin is warm and dry.  ?   Capillary Refill: Capillary refill takes less than 2 seconds.  ?Neurological:  ?   General: No focal deficit present.  ?   Mental Status: She is alert and oriented to person, place, and time. Mental status is at baseline.  ?Psychiatric:     ?   Mood and Affect: Mood normal.     ?   Behavior: Behavior normal.     ?   Thought Content: Thought content normal.     ?   Judgment: Judgment normal.  ? ? ?ED Results / Procedures / Treatments   ?Labs ?(all labs ordered are listed, but only abnormal results are displayed) ?Labs Reviewed  ?COMPREHENSIVE METABOLIC PANEL - Abnormal; Notable for the following components:  ?    Result Value  ? Potassium 3.3 (*)   ? CO2 20 (*)   ? Glucose, Bld 153 (*)   ? All other components within normal limits  ?CBC - Abnormal; Notable for the following components:  ? WBC 18.1 (*)   ? All other components within normal limits  ?LIPASE, BLOOD  ?URINALYSIS, ROUTINE W REFLEX MICROSCOPIC  ?I-STAT BETA HCG BLOOD, ED ([facility], [facility], [facility] ONLY)  ? ?EKG ?None ? ?Radiology ?CT Abdomen Pelvis W Contrast ? ?Result Date: 06/06/2021 ?CLINICAL DATA:  Right lower quadrant abdominal pain and vomiting. EXAM: CT ABDOMEN AND PELVIS WITH CONTRAST TECHNIQUE: Multidetector CT imaging of the abdomen and pelvis was performed using the standard protocol following bolus administration of intravenous contrast. RADIATION DOSE REDUCTION: This exam was  performed according to the departmental dose-optimization program which includes automated exposure control, adjustment of the mA and/or kV according to patient size and/or use of iterative reconstruction technique. CONTRAST:  100mL OMNIPAQUE IOHEXOL 300 MG/ML  SOLN COMPARISON:  None. FINDINGS: Lower chest: No acute abnormality. Hepatobiliary: No focal liver abnormality is seen. No gallstones, gallbladder wall thickening, or biliary dilatation. Pancreas: Unremarkable. No pancreatic ductal dilatation or surrounding inflammatory changes. Spleen: Normal in size without focal abnormality. Adrenals/Urinary Tract: Adrenal glands are unremarkable. Kidneys are normal, without renal calculi, focal lesion, or hydronephrosis. Bladder is unremarkable. Stomach/Bowel: Fluid-filled, mildly dilated appendix in the right lower quadrant measuring 9 mm in diameter. Suspected tiny appendicoliths at the base (series 6,  images 44 and 45). Mild mucosal hyperenhancement and trace periappendiceal fat stranding. No extraluminal air or peroneal appendiceal fluid collection. The stomach, small bowel, and colon are unremarkable. Vascular/Lymphatic: No significant vascular findings are present. No enlarged abdominal or pelvic lymph nodes. Reproductive: Uterus and bilateral adnexa are unremarkable. Other: No abdominal wall hernia or abnormality. No abdominopelvic ascites. No pneumoperitoneum. Musculoskeletal: No acute or significant osseous findings. IMPRESSION: 1. Early acute uncomplicated appendicitis. Electronically Signed   By: Titus Dubin M.D.   On: 06/06/2021 09:46   ? ?Procedures ?Procedures  ? ?Medications Ordered in ED ?Medications  ?ondansetron (ZOFRAN) injection 4 mg (4 mg Intravenous Given 06/06/21 0746)  ?sodium chloride 0.9 % bolus 1,000 mL (0 mLs Intravenous Stopped 06/06/21 0908)  ?morphine (PF) 4 MG/ML injection 4 mg (4 mg Intravenous Given 06/06/21 0747)  ?iohexol (OMNIPAQUE) 300 MG/ML solution 100 mL (100 mLs Intravenous  Contrast Given 06/06/21 0929)  ? ?ED Course/ Medical Decision Making/ A&P ?  ?                        ?Medical Decision Making ?Amount and/or Complexity of Data Reviewed ?Labs: ordered. ?Radiology: ordered. ? ?Risk ?Pr

## 2021-06-06 NOTE — ED Triage Notes (Signed)
Per EMS, pt from home c/o RLQ abdominal pain that started at 1am accompanied w/ vomiting.  Pt is unable to remember what she ate for dinner last night.   ? ?154/90 ?100HR ?18 RR ?96% RA ?104CBG ?

## 2021-06-06 NOTE — ED Notes (Signed)
Patient transported to CT 

## 2021-06-06 NOTE — ED Notes (Signed)
PA at bedside.

## 2021-06-06 NOTE — H&P (Signed)
?[facility] ?Admission Note ? ?Tracey DonJenna Clarin ?12-29-2000  ?[id number].   ? ?Requesting MD: Cristal Deerhristopher Tegeler ?Chief Complaint/Reason for Consult: appendicitis ? ?HPI:  ?Tracey Harrison is a 21yo female PMH asthma, IBS, and POTS who [facility] with acute onset abdominal pain at 0100 this morning. Pain is epigastric and radiates into the RLQ. It is intermittent and severe. Associated with nausea, vomiting, and chills.  ?Patient was worked up by [employer]. Lab work pertinent for WBC 18.1, K 3.3. CT scan shows Early acute uncomplicated appendicitis. ?General surgery asked to see. ? ?She has had nothing to eat/drink since yesterday ? ?Abdominal surgical history: none ?Anticoagulants: none ?Nonsmoker ?Denies alcohol use ?Employment: in school ? ?Mother is bedside ? ?Review of Systems  ?Constitutional:  Positive for chills.  ?Respiratory: Negative.    ?Cardiovascular: Negative.   ?Gastrointestinal:  Positive for abdominal pain, nausea and vomiting.  ? ?All systems reviewed and otherwise negative except for as above ? ?No family history on file. ? ?Past Medical History:  ?Diagnosis Date  ? Asthma   ? ? ?Past Surgical History:  ?Procedure Laterality Date  ? ADENOIDECTOMY    ? SINOSCOPY    ? TYMPANOSTOMY TUBE PLACEMENT    ? ? ?Social History:  reports that she has never smoked. She has never used smokeless tobacco. No history on file for alcohol use and drug use. ? ?Allergies:  ?Allergies  ?Allergen Reactions  ? Monosodium Glutamate Anaphylaxis  ? Penicillins Hives, Rash and Swelling  ?  Other reaction(s): Weal (disorder) ?  ? Rice Hives and Other (See Comments)  ? Varicella Virus Vaccine Live Rash  ? Lactose Intolerance (Gi) Other (See Comments)  ?  Stomach upset  ? Pollen Extract   ? ? ?(Not in a hospital admission) ? ? ?Prior to Admission medications   ?Medication Sig Start Date End Date Taking? Authorizing Provider  ?albuterol (VENTOLIN HFA) 108 (90 Base) MCG/ACT inhaler Inhale 2 puffs into the  lungs every 4 (four) hours as needed for wheezing or shortness of breath (coughing fits). 10/15/20  Yes Ellamae SiaKim, Yoon M, DO  ?Ascorbic Acid (VITAMIN C) 1000 MG tablet Take 1,000 mg by mouth daily.   Yes [provider]  ?Cholecalciferol (VITAMIN D) 50 MCG (2000 UT) CAPS Take 2,000 Units by mouth daily.   Yes [provider]  ?EPINEPHrine 0.3 mg/0.3 mL IJ SOAJ injection Inject 0.3 mg into the muscle as needed for anaphylaxis. 10/04/19  Yes [provider]  ?famotidine (PEPCID) 40 MG tablet Take 40 mg by mouth 2 (two) times daily. 05/02/21  Yes [provider]  ?fluticasone (FLONASE) 50 MCG/ACT nasal spray Place 2 sprays into both nostrils daily. 07/23/19  Yes [provider]  ?lansoprazole (PREVACID) 30 MG capsule Take 30 mg by mouth daily. 05/02/21  Yes [provider]  ?levocetirizine (XYZAL) 5 MG tablet Take 5 mg by mouth every evening.   Yes [provider]  ?Levonorgestrel-Ethinyl Estradiol (SEASONIQUE) 0.15-0.03 &0.01 MG tablet Take 1 tablet by mouth daily.   Yes [provider]  ?ondansetron (ZOFRAN-ODT) 4 MG disintegrating tablet Take 4 mg by mouth every 8 (eight) hours as needed for nausea or vomiting. 01/22/17  Yes [provider]  ?OVER THE COUNTER MEDICATION Take 1 Dose by mouth 2 (two) times daily as needed (joint pain). CBD 30mg    Yes [provider]  ?Probiotic Product (ALIGN) 4 MG CAPS Take 4 mg by mouth daily.   Yes [provider]  ?sertraline (ZOLOFT) 100  MG tablet Take 100 mg by mouth daily.   Yes [provider]  ? ? ?Blood pressure 128/74, pulse 64, temperature 98 ?F (36.7 ?C), temperature source Oral, resp. rate 16, height 5\' 8"  (1.727 m), weight 97.1 kg, SpO2 97 %. ?Physical Exam: ?General: pleasant, WD/WN female who is laying in bed in NAD ?HEENT: head is normocephalic, atraumatic.  Sclera are noninjected.  Pupils equal and round.  Ears and nose without any masses or lesions.  Mouth is pink and  moist. ?Heart: regular, rate, and rhythm.  Normal s1,s2. No obvious murmurs, gallops, or rubs noted.  Palpable pedal pulses bilaterally  ?Lungs: CTAB, no wheezes, rhonchi, or rales noted.  Respiratory effort nonlabored ?Abd: soft, TTP RLQ without rebound or guarding, ND, +BS, no masses, hernias, or organomegaly ?MS: no BUE/BLE edema, calves soft and nontender ?Skin: warm and dry with no masses, lesions, or rashes ?Psych: A&Ox4 with an appropriate affect ?Neuro: cranial nerves grossly intact, equal strength in BUE/BLE bilaterally, normal speech, thought process intact ? ?Results for orders placed or performed during the hospital encounter of 06/06/21 (from the past 48 hour(s))  ?Lipase, blood     Status: None  ? Collection Time: 06/06/21  7:00 AM  ?Result Value Ref Range  ? Lipase 30 11 - 51 U/L  ?  Comment: Performed at [facility], [address], [place], [place]  ?Comprehensive metabolic panel     Status: Abnormal  ? Collection Time: 06/06/21  7:00 AM  ?Result Value Ref Range  ? Sodium 135 135 - 145 mmol/L  ? Potassium 3.3 (L) 3.5 - 5.1 mmol/L  ? Chloride 104 98 - 111 mmol/L  ? CO2 20 (L) 22 - 32 mmol/L  ? Glucose, Bld 153 (H) 70 - 99 mg/dL  ?  Comment: Glucose reference range applies only to samples taken after fasting for at least 8 hours.  ? BUN 9 6 - 20 mg/dL  ? Creatinine, Ser 0.87 0.44 - 1.00 mg/dL  ? Calcium 9.4 8.9 - 10.3 mg/dL  ? Total Protein 6.8 6.5 - 8.1 g/dL  ? Albumin 3.9 3.5 - 5.0 g/dL  ? AST 22 15 - 41 U/L  ? ALT 23 0 - 44 U/L  ? Alkaline Phosphatase 49 38 - 126 U/L  ? Total Bilirubin 0.7 0.3 - 1.2 mg/dL  ? GFR, Estimated >60 >60 mL/min  ?  Comment: (NOTE) ?Calculated using the CKD-EPI Creatinine Equation (2021) ?  ? Anion gap 11 5 - 15  ?  Comment: Performed at [facility], [address] [place], [place]  ?CBC     Status: Abnormal  ? Collection Time: 06/06/21  7:00 AM  ?Result Value Ref Range  ? WBC 18.1 (H) 4.0 - 10.5 K/uL  ? RBC 4.98 3.87 - 5.11 MIL/uL  ?  Hemoglobin 14.0 12.0 - 15.0 g/dL  ? HCT 42.0 36.0 - 46.0 %  ? MCV 84.3 80.0 - 100.0 fL  ? MCH 28.1 26.0 - 34.0 pg  ? MCHC 33.3 30.0 - 36.0 g/dL  ? RDW 12.5 11.5 - 15.5 %  ? Platelets 350 150 - 400 K/uL  ? nRBC 0.0 0.0 - 0.2 %  ?  Comment: Performed at [facility], [address] [place], [place]  ?I-Stat beta hCG blood, ED     Status: None  ? Collection Time: 06/06/21  8:45 AM  ?Result Value Ref Range  ? I-stat hCG, quantitative <5.0 <5 mIU/mL  ?  Comment 3          ?  Comment:   GEST. AGE      CONC.  (mIU/mL) ?  <=1 WEEK        5 - 50 ?    2 WEEKS       50 - 500 ?    3 WEEKS       100 - 10,000 ?    4 WEEKS     1,000 - 30,000 ?       ?FEMALE AND NON-PREGNANT FEMALE: ?    LESS THAN 5 mIU/mL ?  ?Urinalysis, Routine w reflex microscopic Urine, Clean Catch     Status: Abnormal  ? Collection Time: 06/06/21  9:38 AM  ?Result Value Ref Range  ? Color, Urine YELLOW YELLOW  ? APPearance CLEAR CLEAR  ? Specific Gravity, Urine 1.027 1.005 - 1.030  ? pH 6.0 5.0 - 8.0  ? Glucose, UA NEGATIVE NEGATIVE mg/dL  ? Hgb urine dipstick MODERATE (A) NEGATIVE  ? Bilirubin Urine NEGATIVE NEGATIVE  ? Ketones, ur 5 (A) NEGATIVE mg/dL  ? Protein, ur NEGATIVE NEGATIVE mg/dL  ? Nitrite NEGATIVE NEGATIVE  ? Leukocytes,Ua MODERATE (A) NEGATIVE  ? RBC / HPF 0-5 0 - 5 RBC/hpf  ? WBC, UA 0-5 0 - 5 WBC/hpf  ? Bacteria, UA RARE (A) NONE SEEN  ? Squamous Epithelial / LPF 0-5 0 - 5  ? Mucus PRESENT   ?  Comment: Performed at [facility], [address], [place], [place] [address]  ? ?CT Abdomen Pelvis W Contrast ? ?Result Date: 06/06/2021 ?CLINICAL DATA:  Right lower quadrant abdominal pain and vomiting. EXAM: CT ABDOMEN AND PELVIS WITH CONTRAST TECHNIQUE: Multidetector CT imaging of the abdomen and pelvis was performed using the standard protocol following bolus administration of intravenous contrast. RADIATION DOSE REDUCTION: This exam was performed according to the departmental dose-optimization program which includes automated  exposure control, adjustment of the mA and/or kV according to patient size and/or use of iterative reconstruction technique. CONTRAST:  OMNIPAQUE IOHEXOL 300 MG/ML  SOLN COMPARISON:  None. FINDINGS: Rana Snare

## 2021-06-06 NOTE — Anesthesia Preprocedure Evaluation (Signed)
Anesthesia Evaluation  ?Patient identified by MRN, date of birth, ID band ?Patient awake ? ? ? ?Reviewed: ?Allergy & Precautions, NPO status , Patient's Chart, lab work & pertinent test results ? ?Airway ?Mallampati: II ? ?TM Distance: >3 FB ?Neck ROM: Full ? ? ? Dental ? ?(+) Dental Advisory Given ?  ?Pulmonary ?asthma ,  ?  ?breath sounds clear to auscultation ? ? ? ? ? ? Cardiovascular ?negative cardio ROS ? ? ?Rhythm:Regular Rate:Normal ? ? ?  ?Neuro/Psych ?negative neurological ROS ?   ? GI/Hepatic ?Neg liver ROS, Acute appendicitis ?  ?Endo/Other  ?negative endocrine ROS ? Renal/GU ?negative Renal ROS  ? ?  ?Musculoskeletal ? ? Abdominal ?  ?Peds ? Hematology ?negative hematology ROS ?(+)   ?Anesthesia Other Findings ? ? Reproductive/Obstetrics ? ?  ? ? ? ? ? ? ? ? ? ? ? ? ? ?  ?  ? ? ? ? ? ? ? ? ?Lab Results  ?Component Value Date  ? WBC 18.1 (H) 06/06/2021  ? HGB 14.0 06/06/2021  ? HCT 42.0 06/06/2021  ? MCV 84.3 06/06/2021  ? PLT 350 06/06/2021  ? ?Lab Results  ?Component Value Date  ? CREATININE 0.87 06/06/2021  ? BUN 9 06/06/2021  ? NA 135 06/06/2021  ? K 3.3 (L) 06/06/2021  ? CL 104 06/06/2021  ? CO2 20 (L) 06/06/2021  ? ? ?Anesthesia Physical ?Anesthesia Plan ? ?ASA: 2 ? ?Anesthesia Plan: General  ? ?Post-op Pain Management: Tylenol PO (pre-op)*  ? ?Induction: Intravenous ? ?PONV Risk Score and Plan: 3 and Midazolam, Dexamethasone, Ondansetron and Treatment may vary due to age or medical condition ? ?Airway Management Planned: Oral ETT ? ?Additional Equipment: None ? ?Intra-op Plan:  ? ?Post-operative Plan: Extubation in OR ? ?Informed Consent: I have reviewed the patients History and Physical, chart, labs and discussed the procedure including the risks, benefits and alternatives for the proposed anesthesia with the patient or authorized representative who has indicated his/her understanding and acceptance.  ? ? ? ?Dental advisory given ? ?Plan Discussed with:  CRNA ? ?Anesthesia Plan Comments:   ? ? ? ? ? ? ?Anesthesia Quick Evaluation ? ?

## 2021-06-06 NOTE — ED Notes (Signed)
Pt states 8/10 pain. Meds recenlty given. PA notified of pt request for more pain meds. Awaiting orders  ?

## 2021-06-06 NOTE — Op Note (Signed)
? ?Patient: Tracey Harrison (Oct 28, 2000, [id number]) ? ?Date of Surgery: 06/06/2021  ? ?Preoperative Diagnosis: Acute appendicitis  ? ?Postoperative Diagnosis: Acute appendicitis  ? ?Surgical Procedure: APPENDECTOMY LAPAROSCOPIC:   ? ?Operative Team Members:  ?Surgeon(s) and Role: ?   * Ranell Skibinski, Hyman Hopes, MD - Primary  ? ?Anesthesiologist: Mellody Dance, MD; Marcene Duos, MD ?CRNA: Kara Mead, CRNA  ? ?Anesthesia: General  ? ?Fluids:  ?Total I/O ?In: 800 [I.V.:700; IV Piggyback:100] ?Out: -  ? ?Complications: None ? ?Drains:  none  ? ?Specimen:  ?ID Type Source Tests Collected by Time Destination  ?1 : appendix GI Appendix SURGICAL PATHOLOGY Vanden Fawaz, Hyman Hopes, MD 06/06/2021 1422   ?  ? ?Disposition:  PACU - hemodynamically stable. ? ?Plan of Care: Admit for overnight observation ? ? ? ?Indications for Procedure: Tracey Harrison is a 21 y.o. female who presented with abdominal pain.  History, physical and imaging was concerning for appendicitis, so laparoscopic appendectomy was recommended for the patient.  The procedure itself, as well as the risks, benefits and alternatives were discussed with the patient.  Risks discussed included but were not limited to the risk of bleeding, infection, damage to nearby structures, need to convert to open procedure, incisional hernia, and the need for additional procedures or surgeries.  With this discussion complete and all questions answered the patient granted consent to proceed. ? ?Findings: Inflamed appendix ? ?Infection status: ?Patient: Tracey Harrison Emergency General Surgery Service Patient ?Case: Emergent ?Infection Present At Time Of Surgery (PATOS):  inflamed appendix ? ? ?Description of Procedure:  ? ?On the date stated above, the patient was taken to the operating room suite and placed in supine positioning with the left arm tucked.  Sequential compression devices were placed on the lower extremities to prevent blood clots.  General endotracheal anesthesia was  induced.  The patient urinated just prior to surgery so a foley catheter was not placed.  Preoperative antibiotics were given.  The patient's abdomen was prepped and draped in the usual sterile fashion.  A time-out was completed verifying the correct patient, procedure, positioning and equipment needed for the case. ? ?We began by anesthetizing the skin with local anesthetic and then making a 5 mm incision just below the umbilicus.  We dissected through the subcutaneous tissues to the fascia.  The fascia was grasped and elevated using a Kocher clamp.  A Veress needle was inserted into the abdomen and the abdomen was insufflated to 15 mmHg.  A 5 mm trocar was inserted in this position under optical guidance and then the abdomen was inspected.  There was no trauma to the underlying viscera with initial trocar placement.  Any abnormal findings, other than inflammation in the right lower quadrant, are listed above in the findings section.  Two additional trocars were placed, one 5 mm trocar suprapubically and one 12 mm trocar in the left lower quadrant.  These were placed under direct vision without any trauma to the underlying viscera.   ? ?The patient was then placed in head down, left side down positioning.  The appendix was identified and dissected free from its attachments to the abdominal wall, small intestine and cecum.  A window was created in the mesoappendix using blunt dissection.  We used one 45 mm white load of the endoscopic linear stapler to divide the base of the appendix from the cecum.  The harmonic was used to divide the mesoappendix.  Some bleeding was encountered and controlled with the 5 mm clip applier.  The appendix was placed in an endocatch bag and removed through the 12 mm port site in the left lower quadrant.  The operative field was dry on final inspection. The staple line was well formed.  There was good hemostasis at the end of the case.  At this point we directed our attention to closure.   The patient was moved back to a level position.  The 12 mm trocar site was closed at the fascial level using an 0-vicryl on a fascial suture passer.  The abdomen was desufflated.  The skin was closed using 4-0 Monocryl and dermabond.  All sponge and needle counts were correct at the end of the case. ? ? ? ?Ivar Drape, MD ?General, Bariatric, & Minimally Invasive Surgery ?[facility], [place] ? ?

## 2021-06-06 NOTE — ED Notes (Signed)
Report given to [facility] in surgical short stay.  ?

## 2021-06-06 NOTE — ED Notes (Signed)
Admit MD at bedside

## 2021-06-07 ENCOUNTER — Encounter (HOSPITAL_COMMUNITY): Payer: Self-pay | Admitting: Surgery

## 2021-06-07 ENCOUNTER — Other Ambulatory Visit (HOSPITAL_COMMUNITY): Payer: Self-pay

## 2021-06-07 DIAGNOSIS — R1031 Right lower quadrant pain: Secondary | ICD-10-CM

## 2021-06-07 DIAGNOSIS — Z9049 Acquired absence of other specified parts of digestive tract: Secondary | ICD-10-CM

## 2021-06-07 LAB — MAGNESIUM: Magnesium: 1.5 mg/dL — ABNORMAL LOW (ref 1.7–2.4)

## 2021-06-07 LAB — CBC
HCT: 38.9 % (ref 36.0–46.0)
Hemoglobin: 13.2 g/dL (ref 12.0–15.0)
MCH: 29 pg (ref 26.0–34.0)
MCHC: 33.9 g/dL (ref 30.0–36.0)
MCV: 85.5 fL (ref 80.0–100.0)
Platelets: 318 10*3/uL (ref 150–400)
RBC: 4.55 MIL/uL (ref 3.87–5.11)
RDW: 12.9 % (ref 11.5–15.5)
WBC: 12.7 10*3/uL — ABNORMAL HIGH (ref 4.0–10.5)
nRBC: 0 % (ref 0.0–0.2)

## 2021-06-07 LAB — BASIC METABOLIC PANEL
Anion gap: 8 (ref 5–15)
BUN: 5 mg/dL — ABNORMAL LOW (ref 6–20)
CO2: 21 mmol/L — ABNORMAL LOW (ref 22–32)
Calcium: 8.8 mg/dL — ABNORMAL LOW (ref 8.9–10.3)
Chloride: 109 mmol/L (ref 98–111)
Creatinine, Ser: 0.74 mg/dL (ref 0.44–1.00)
GFR, Estimated: >60 mL/min (ref >60)
Glucose, Bld: 160 mg/dL — ABNORMAL HIGH (ref 70–99)
Potassium: 3.9 mmol/L (ref 3.5–5.1)
Sodium: 138 mmol/L (ref 135–145)

## 2021-06-07 MED ORDER — OXYCODONE HCL 5 MG PO TABS
5.0000 mg | ORAL_TABLET | Freq: Four times a day (QID) | ORAL | 0 refills | Status: AC | PRN
  Filled 2021-06-07: qty 16, 4d supply, fill #0

## 2021-06-07 MED ORDER — DOCUSATE SODIUM 100 MG PO CAPS: 100.0000 mg | ORAL_CAPSULE | Freq: Two times a day (BID) | ORAL | Status: AC | PRN

## 2021-06-07 MED ORDER — MAGNESIUM OXIDE -MG SUPPLEMENT 400 (240 MG) MG PO TABS
400.0000 mg | ORAL_TABLET | Freq: Once | ORAL | Status: AC
  Administered 2021-06-07: 400 mg via ORAL
  Filled 2021-06-07: qty 1

## 2021-06-07 MED ORDER — ACETAMINOPHEN 500 MG PO TABS: 1000.0000 mg | ORAL_TABLET | Freq: Three times a day (TID) | ORAL | Status: AC

## 2021-06-07 NOTE — Discharge Summary (Signed)
?[facility] ?Discharge Summary  ? ?Patient ID: ?Tracey Harrison ?MRN: [id number] ?DOB/AGE: 04-Jan-2001 21 y.o. ? ?Admit date: 06/06/2021 ?Discharge date: 06/07/2021 ? ?Admitting Diagnosis: ?Right lower quadrant abdominal pain [R10.31] ?Acute appendicitis [K35.80] ? ? ?Discharge Diagnosis ?S/p laparoscopic appendectomy ? ?Consultants ?None ? ?Imaging: ?CT Abdomen Pelvis W Contrast ? ?Result Date: 06/06/2021 ?CLINICAL DATA:  Right lower quadrant abdominal pain and vomiting. EXAM: CT ABDOMEN AND PELVIS WITH CONTRAST TECHNIQUE: Multidetector CT imaging of the abdomen and pelvis was performed using the standard protocol following bolus administration of intravenous contrast. RADIATION DOSE REDUCTION: This exam was performed according to the departmental dose-optimization program which includes automated exposure control, adjustment of the mA and/or kV according to patient size and/or use of iterative reconstruction technique. CONTRAST:  OMNIPAQUE IOHEXOL 300 MG/ML  SOLN COMPARISON:  None. FINDINGS: Lower chest: No acute abnormality. Hepatobiliary: No focal liver abnormality is seen. No gallstones, gallbladder wall thickening, or biliary dilatation. Pancreas: Unremarkable. No pancreatic ductal dilatation or surrounding inflammatory changes. Spleen: Normal in size without focal abnormality. Adrenals/Urinary Tract: Adrenal glands are unremarkable. Kidneys are normal, without renal calculi, focal lesion, or hydronephrosis. Bladder is unremarkable. Stomach/Bowel: Fluid-filled, mildly dilated appendix in the right lower quadrant measuring 9 mm in diameter. Suspected tiny appendicoliths at the base (series 6, images 44 and 45). Mild mucosal hyperenhancement and trace periappendiceal fat stranding. No extraluminal air or peroneal appendiceal fluid collection. The stomach, small bowel, and colon are unremarkable. Vascular/Lymphatic: No significant vascular findings are present. No enlarged abdominal or pelvic lymph  nodes. Reproductive: Uterus and bilateral adnexa are unremarkable. Other: No abdominal wall hernia or abnormality. No abdominopelvic ascites. No pneumoperitoneum. Musculoskeletal: No acute or significant osseous findings. IMPRESSION: 1. Early acute uncomplicated appendicitis. Electronically Signed   By: Obie Dredge M.D.   On: 06/06/2021 09:46   ? ?Procedures ?Dr. Awilda Metro (06/06/21) - Laparoscopic Appendectomy ? ?Hospital Course:  ?21 year old female who presented to [facility] ED with right lower quadrant pain.  Workup showed acute appendicitis.  Patient was admitted and underwent procedure listed above.  Tolerated procedure well and was transferred to the floor for overnight observation.  Diet was advanced as tolerated.  On POD1, the patient was voiding well, tolerating diet, ambulating well, pain well controlled, vital signs stable, incisions c/d/i and felt stable for discharge home.  Patient will follow up in our office in 2-3 weeks and knows to call with questions or concerns.  She will call to confirm appointment date/time.   ? ?Physical Exam: ?General:  Alert, NAD, pleasant, comfortable ?Abd:  Soft, ND, mild tenderness, incisions C/D/I ? ?I or a member of my team have reviewed this patient in the Controlled Substance Database. ? ?Allergies as of 06/07/2021   ? ?   Reactions  ? Monosodium Glutamate Anaphylaxis  ? Penicillins Hives, Rash, Swelling  ? Other reaction(s): Weal (disorder)  ? Rice Hives, Other (See Comments)  ? Varicella Virus Vaccine Live Rash  ? Lactose Intolerance (gi) Other (See Comments)  ? Stomach upset  ? Pollen Extract   ? ?  ? ?  ?Medication List  ?  ? ?TAKE these medications   ? ?acetaminophen 500 MG tablet ?Commonly known as: TYLENOL ?Take 2 tablets (1,000 mg total) by mouth every 8 (eight) hours for 5 days. ?  ?albuterol 108 (90 Base) MCG/ACT inhaler ?Commonly known as: VENTOLIN HFA ?Inhale 2 puffs into the lungs every 4 (four) hours as needed for wheezing or shortness of breath (coughing  fits). ?  ?Align 4  MG Caps ?Take 4 mg by mouth daily. ?  ?docusate sodium 100 MG capsule ?Commonly known as: COLACE ?Take 1 capsule (100 mg total) by mouth 2 (two) times daily as needed for mild constipation or moderate constipation. ?  ?EPINEPHrine 0.3 mg/0.3 mL Soaj injection ?Commonly known as: EPI-PEN ?Inject 0.3 mg into the muscle as needed for anaphylaxis. ?  ?famotidine 40 MG tablet ?Commonly known as: PEPCID ?Take 40 mg by mouth 2 (two) times daily. ?  ?fluticasone 50 MCG/ACT nasal spray ?Commonly known as: FLONASE ?Place 2 sprays into both nostrils daily. ?  ?lansoprazole 30 MG capsule ?Commonly known as: PREVACID ?Take 30 mg by mouth daily. ?  ?levocetirizine 5 MG tablet ?Commonly known as: XYZAL ?Take 5 mg by mouth every evening. ?  ?ondansetron 4 MG disintegrating tablet ?Commonly known as: ZOFRAN-ODT ?Take 4 mg by mouth every 8 (eight) hours as needed for nausea or vomiting. ?  ?OVER THE COUNTER MEDICATION ?Take 1 Dose by mouth 2 (two) times daily as needed (joint pain). CBD 30mg  ?  ?oxyCODONE 5 MG immediate release tablet ?Commonly known as: Oxy IR/ROXICODONE ?Take 1 tablet (5 mg total) by mouth every 6 (six) hours as needed for up to 5 days for moderate pain or severe pain. ?  ?Seasonique 0.15-0.03 &0.01 MG tablet ?Generic drug: Levonorgestrel-Ethinyl Estradiol ?Take 1 tablet by mouth daily. ?  ?sertraline 100 MG tablet ?Commonly known as: ZOLOFT ?Take 100 mg by mouth daily. ?  ?vitamin C 1000 MG tablet ?Take 1,000 mg by mouth daily. ?  ?Vitamin D 50 MCG (2000 UT) Caps ?Take 2,000 Units by mouth daily. ?  ? ?  ? ? ? ? Follow-up Information   ? ? [facility], [facility]. Call.   ?Specialty: General Surgery ?Why: We are working on your appointment, call to confirm ?Please arrive 30 minutes prior to your appointment to check in and fill out paperwork. Bring photo ID and insurance information. ?Contact information: ?[address] ?[address] ?[place]  [place] ?[phone] ? ?  ?  ? ?  ?  ? ?  ? ? ?Signed: ?[phone] , PA-C ?[facility] ?06/07/2021, 12:08 PM ?Please see Amion for pager number during day hours 7:00am-4:30pm ? ? ? ?

## 2021-06-07 NOTE — Progress Notes (Signed)
Nelle Don to be D/C'd  per MD order.  Discussed with the patient and all questions fully answered. ? ?VSS, Skin clean, dry and intact without evidence of skin break down, no evidence of skin tears noted. ? ?IV catheter discontinued intact. Site without signs and symptoms of complications. Dressing and pressure applied. ? ?An After Visit Summary was printed and given to the patient. Patient received prescription from [facility] pharmacy. ? ?D/c education completed with patient/family including follow up instructions, medication list, d/c activities limitations if indicated, with other d/c instructions as indicated by MD - patient able to verbalize understanding, all questions fully answered.  ? ?Patient instructed to return to ED, call 911, or call MD for any changes in condition.  ? ?Patient to be escorted via WC, and D/C home via private auto.  ?

## 2021-06-07 NOTE — TOC Progression Note (Signed)
Transition of Care (TOC) - Progression Note  ? ? ?Patient Details  ?Name: Marieli Rudy ?MRN: [id number] ?Date of Birth: 12/21/2000 ? ?Transition of Care (TOC) CM/SW Contact  ?Kingsley Plan, RN ?Phone Number: ?06/07/2021, 11:47 AM ? ?Clinical Narrative:    ? ? ?Transition of Care (TOC) Screening Note ? ? ?Patient Details  ?Name: Jadelyn Elks ?Date of Birth: 06-29-2000 ? ? ?Transition of Care Department [facility]) has reviewed patient and no TOC needs have been identified at this time. We will continue to monitor patient advancement through interdisciplinary progression rounds. If new patient transition needs arise, please place a TOC consult. ?  ? ?  ?  ? ?Expected Discharge Plan and Services ?  ?  ?  ?  ?  ?Expected Discharge Date: 06/07/21               ?  ?  ?  ?  ?  ?  ?  ?  ?  ?  ? ? ?Social Determinants of Health (SDOH) Interventions ?  ? ?Readmission Risk Interventions ?   ? View : No data to display.  ?  ?  ?  ? ? ?

## 2021-06-07 NOTE — Anesthesia Postprocedure Evaluation (Signed)
Anesthesia Post Note ? ?Patient: Tracey Harrison ? ?Procedure(s) Performed: APPENDECTOMY LAPAROSCOPIC ? ?  ? ?Patient location during evaluation: PACU ?Anesthesia Type: General ?Level of consciousness: awake ?Pain management: pain level controlled ?Vital Signs Assessment: post-procedure vital signs reviewed and stable ?Respiratory status: spontaneous breathing and respiratory function stable ?Cardiovascular status: stable ?Postop Assessment: no apparent nausea or vomiting ?Anesthetic complications: no ? ? ?No notable events documented. ? ?Last Vitals:  ?Vitals:  ? 06/06/21 2001 06/07/21 0736  ?BP: (!) 128/57 129/79  ?Pulse: (!) 101 67  ?Resp: 17 18  ?Temp: 37.3 ?C 36.6 ?C  ?SpO2: 97% 99%  ?  ?Last Pain:  ?Vitals:  ? 06/07/21 1108  ?TempSrc:   ?PainSc: 2   ? ?Pain Goal: Patients Stated Pain Goal: 2 (06/07/21 0407) ? ?  ?  ?  ?  ?  ?  ?  ? ?Merlinda Frederick ? ? ? ? ?

## 2021-06-10 LAB — SURGICAL PATHOLOGY

## 2021-11-27 ENCOUNTER — Ambulatory Visit (INDEPENDENT_AMBULATORY_CARE_PROVIDER_SITE_OTHER): Payer: 59 | Admitting: *Deleted

## 2021-11-27 DIAGNOSIS — J309 Allergic rhinitis, unspecified: Secondary | ICD-10-CM | POA: Diagnosis not present

## 2021-11-27 NOTE — Progress Notes (Signed)
Immunotherapy   Patient Details  Name: Estefani Bateson MRN: [id number] Date of Birth: 2000-03-30  11/27/2021  Nelle Don  Patient has brought back her allergy vials and injection records/instructions to continue her allergy injections while she is in school. Patient is currently on Red vials, then she will advance to Green Vials, then to Yellow vials which is Maintenance. Her records have been put in the Out of Office [facility]inder, and her vials have been placed in a box and filed in the fridge with other vials.   Floree Zuniga Fernandez-Vernon 11/27/2021, 12:27 PM

## 2022-01-08 ENCOUNTER — Ambulatory Visit (HOSPITAL_COMMUNITY): Admit: 2022-01-08 | Discharge status: Against Medical Advice | Payer: 59

## 2022-01-10 ENCOUNTER — Encounter: Payer: Self-pay | Admitting: Family Medicine

## 2022-01-10 ENCOUNTER — Ambulatory Visit: Payer: 59 | Admitting: Family Medicine

## 2022-01-10 ENCOUNTER — Telehealth: Payer: Self-pay | Admitting: *Deleted

## 2022-01-10 VITALS — BP 110/78 | HR 109 | Temp 98.0°F | Resp 16 | Ht 68.0 in | Wt 213.2 lb

## 2022-01-10 DIAGNOSIS — J019 Acute sinusitis, unspecified: Secondary | ICD-10-CM

## 2022-01-10 DIAGNOSIS — H1013 Acute atopic conjunctivitis, bilateral: Secondary | ICD-10-CM | POA: Diagnosis not present

## 2022-01-10 DIAGNOSIS — J3089 Other allergic rhinitis: Secondary | ICD-10-CM

## 2022-01-10 DIAGNOSIS — K219 Gastro-esophageal reflux disease without esophagitis: Secondary | ICD-10-CM | POA: Insufficient documentation

## 2022-01-10 DIAGNOSIS — H1010 Acute atopic conjunctivitis, unspecified eye: Secondary | ICD-10-CM

## 2022-01-10 DIAGNOSIS — J4521 Mild intermittent asthma with (acute) exacerbation: Secondary | ICD-10-CM

## 2022-01-10 DIAGNOSIS — R112 Nausea with vomiting, unspecified: Secondary | ICD-10-CM | POA: Insufficient documentation

## 2022-01-10 DIAGNOSIS — J302 Other seasonal allergic rhinitis: Secondary | ICD-10-CM

## 2022-01-10 DIAGNOSIS — B9689 Other specified bacterial agents as the cause of diseases classified elsewhere: Secondary | ICD-10-CM

## 2022-01-10 MED ORDER — EPINEPHRINE 0.3 MG/0.3ML IJ SOAJ: 0.3000 mg | INTRAMUSCULAR | 1 refills | Status: AC | PRN

## 2022-01-10 MED ORDER — FAMOTIDINE 40 MG PO TABS: 40.0000 mg | ORAL_TABLET | Freq: Every day | ORAL | 5 refills | Status: AC

## 2022-01-10 MED ORDER — FLUTICASONE PROPIONATE HFA 110 MCG/ACT IN AERO: 2.0000 | INHALATION_SPRAY | Freq: Two times a day (BID) | RESPIRATORY_TRACT | 5 refills | Status: AC

## 2022-01-10 MED ORDER — ALBUTEROL SULFATE HFA 108 (90 BASE) MCG/ACT IN AERS: 2.0000 | INHALATION_SPRAY | RESPIRATORY_TRACT | 1 refills | Status: AC | PRN

## 2022-01-10 MED ORDER — CEFDINIR 300 MG PO CAPS: 300.0000 mg | ORAL_CAPSULE | Freq: Two times a day (BID) | ORAL | 0 refills | Status: AC

## 2022-01-10 MED ORDER — PREDNISONE 10 MG PO TABS: ORAL_TABLET | ORAL | 0 refills | Status: AC

## 2022-01-10 MED ORDER — MONTELUKAST SODIUM 10 MG PO TABS: 10.0000 mg | ORAL_TABLET | Freq: Every day | ORAL | 5 refills | Status: AC

## 2022-01-10 MED ORDER — ONDANSETRON HCL 4 MG PO TABS: 4.0000 mg | ORAL_TABLET | Freq: Three times a day (TID) | ORAL | 0 refills | Status: AC | PRN

## 2022-01-10 NOTE — Patient Instructions (Addendum)
Asthma Prednisone 10 mg tablets. Take 2 tablets once a day for 4 days, then take 1 tablet on the 5th day, then stop.   For now and for asthma flare, begin Flovent 110-2 puffs twice a day for 2 weeks or until cough and wheeze free Continue montelukast 10 mg once a day to prevent cough or wheeze Continue albuterol 2 puffs once every 4 hours as needed for cough or wheeze You may use albuterol 2 puffs 5 to 15 minutes before activity to decrease cough or wheeze  Acute bacterial sinusitis Stop Doxycycline at this time Begin Cefdinir 300 mg twice a day for the next 5 days Begin Flonase 2 sprays in each nostril once a day for nasal congestion Begin nasal saline rinses.  Use these before medicated nasal sprays for best results Begin Mucinex 600 to 1200 mg twice a day and increase fluid intake to thin mucus.  Allergic rhinitis Continue allergen avoidance measures directed toward grass pollen, weed pollen, tree pollen, and mold as listed below Continue cetirizine 10 mg once a day as needed for runny nose or itch Continue Flonase 2 sprays in each nostril once a day as needed for a stuffy nose Consider saline nasal rinses as needed for nasal symptoms. Use this before any medicated nasal sprays for best result Continue allergen immunotherapy once you are feeling well.  Continue to have access to an epinephrine autoinjector set  Allergic conjunctivitis Some over the counter eye drops include Pataday one drop in each eye once a day as needed for red, itchy eyes OR Zaditor one drop in each eye twice a day as needed for red itchy eyes.  Reflux Continue dietary lifestyle modifications as listed below Continue famotidine 40 mg once a day to control reflux  Vomiting Begin Zofran 4 mg once every 8 hours if needed for vomiting  Food allergy Continue to avoid rice.  Consider updating your food allergy testing if you are interested.  Remember to stop antihistamines for 3 days before your testing  appointment  Call the clinic if your symptoms worsen, do not improve, or if you develop a fever  Call the clinic if this treatment plan is not working well for you.  Follow up in 2 months or sooner if needed.  Reducing Pollen Exposure The American Academy of All[employer], Asthma and Immunology suggests the following steps to reduce your exposure to pollen during allergy seasons. Do not hang sheets or clothing out to dry; pollen may collect on these items. Do not mow lawns or spend time around freshly cut grass; mowing stirs up pollen. Keep windows closed at night.  Keep car windows closed while driving. Minimize morning activities outdoors, a time when pollen counts are usually at their highest. Stay indoors as much as possible when pollen counts or humidity is high and on windy days when pollen tends to remain in the air longer. Use air conditioning when possible.  Many air conditioners have filters that trap the pollen spores. Use a HEPA room air filter to remove pollen form the indoor air you breathe.  Control of Mold Allergen Mold and fungi can grow on a variety of surfaces provided certain temperature and moisture conditions exist.  Outdoor molds grow on plants, decaying vegetation and soil.  The major outdoor mold, Alternaria and Cladosporium, are found in very high numbers during hot and dry conditions.  Generally, a late Summer - Fall peak is seen for common outdoor fungal spores.  Rain will temporarily lower outdoor mold spore  count, but counts rise rapidly when the rainy period ends.  The most important indoor molds are Aspergillus and Penicillium.  Dark, humid and poorly ventilated basements are ideal sites for mold growth.  The next most common sites of mold growth are the bathroom and the kitchen.  Outdoor [employer] Use air conditioning and keep windows closed Avoid exposure to decaying vegetation. Avoid leaf raking. Avoid grain handling. Consider wearing a face mask if working  in moldy areas.  Indoor Mold Control Maintain humidity below 50%. Clean washable surfaces with 5% bleach solution. Remove sources e.g. Contaminated carpets.

## 2022-01-10 NOTE — Progress Notes (Signed)
[place] [address] Dept: [phone]  FOLLOW UP NOTE  Patient ID: Tracey Harrison, female    DOB: June 10, 2000  Age: 21 y.o. MRN: [id number] Date of Office Visit: 01/10/2022  Assessment  Chief Complaint: Cough and Sinusitis  HPI Tracey Harrison is a 21 year old female who presents to the clinic today for evaluation of cough.  She was last seen in this clinic as a new patient on 10/15/2020 by Dr. 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 She began allergen immunotherapy at that time. 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  In the interim, she reports that she began to experience symptoms of bacterial sinusitis for which she went to urgent care and received doxycycline 2 days ago.  She reports that her first dose of doxycycline was Wednesday evening after which she began to experience acute abdominal pain and vomiting.  She has vomited 4 times since Wednesday.  She denies fever, sweats, chills, and sick contacts.  Asthma is reported as not well controlled over the last 10 days with symptoms including cough producing thick green mucus and shortness of breath which is associated with coughing.  She denies wheezing with rest or activity.  She has used albuterol 6 or 7 times over the last week with moderate relief of symptoms.  She reports that she usually begins to take montelukast in October, however, she has not started taking montelukast yet for this year.  Allergic rhinitis is reported as moderately well controlled with nasal congestion and postnasal drainage as the main symptoms.  She continues cetirizine 10 mg once a day and occasionally uses a nasal saline rinse.  She is not currently using a nasal steroid spray.  She continues allergen immunotherapy with no large or local reactions.  She reports a  significant decrease in her symptoms of allergic rhinitis while continuing on allergen immunotherapy.  Acute sinusitis is reported as not well controlled with continued nasal congestion and thick green drainage.  She continues doxycycline twice a day noting abdominal pain and vomiting.  She does have a penicillin allergy which she reports causes anaphylactic symptoms.  She has been able to tolerate cefdinir several times in the past.  She is requesting to stop doxycycline and switch to cefdinir at this time.  Allergic conjunctivitis is reported as moderately well controlled with green drainage occurring from the right eye several days ago which has resolved at this time.  Other than vomiting over the last several days, reflux is reported as moderately well controlled with Pepcid 40 mg once a day.  Her current medications are listed in the chart.  Drug Allergies:  Allergies  Allergen Reactions   Monosodium Glutamate Anaphylaxis   Penicillins Hives, Rash and Swelling    Other reaction(s): Weal (disorder)    Rice Hives and Other (See Comments)   Varicella Virus Vaccine Live Rash   Lactose Intolerance (Gi) Other (See Comments)    Stomach upset   Pollen Extract     Physical Exam: BP 110/78 (BP Location: Right Arm, Patient Position: Sitting, Cuff Size: Normal)   Pulse (!) 109   Temp 98 F (36.7 C) (Temporal)   Resp 16   Ht 5\' 8"  (1.727 m)   Wt 213 lb 3.2 oz (96.7 kg)   SpO2 96%   BMI 32.42 kg/m    Physical Exam Vitals reviewed.  Constitutional:      Appearance: Normal appearance.  HENT:     Head: Normocephalic and atraumatic.     Right Ear: Tympanic membrane normal.     Left Ear: Tympanic membrane normal.     Nose:     Comments: Bilateral nares slightly erythematous with clear nasal drainage noted.  Pharynx normal.  Ears normal.  Eyes normal.    Mouth/Throat:     Pharynx: Oropharynx is clear.  Eyes:     Conjunctiva/sclera: Conjunctivae normal.  Cardiovascular:     Rate and  Rhythm: Normal rate and regular rhythm.     Heart sounds: Normal heart sounds. No murmur heard. Pulmonary:     Effort: Pulmonary effort is normal.     Breath sounds: Normal breath sounds.     Comments: Lungs clear to auscultation Musculoskeletal:        General: Normal range of motion.     Cervical back: Normal range of motion and neck supple.  Skin:    General: Skin is warm and dry.  Neurological:     Mental Status: She is alert and oriented to person, place, and time.  Psychiatric:        Mood and Affect: Mood normal.        Behavior: Behavior normal.        Thought Content: Thought content normal.        Judgment: Judgment normal.     Diagnostics: FVC 3.33, FEV1 2.14.  Predicted FVC 2.30, predicted FEV1 3.72.  Spirometry indicates mild restriction and mild airway obstruction.  Postbronchodilator FVC 4.38, FEV1 3.57.  Postbronchodilator spirometry indicates 67% improvement in FEV1.  Assessment and Plan: 1. Mild intermittent asthma with acute exacerbation   2. Seasonal and perennial allergic rhinitis   3. Seasonal allergic conjunctivitis   4. Acute bacterial sinusitis   5. Gastroesophageal reflux disease, unspecified whether esophagitis present   6. Nausea and vomiting, unspecified vomiting type     Meds ordered this encounter  Medications   albuterol (VENTOLIN HFA) 108 (90 Base) MCG/ACT inhaler    Sig: Inhale 2 puffs into the lungs every 4 (four) hours as needed for wheezing or shortness of breath.    Dispense:  18 g    Refill:  1   EPINEPHrine (EPIPEN 2-PAK) 0.3 mg/0.3 mL IJ SOAJ injection    Sig: Inject 0.3 mg into the muscle as needed for anaphylaxis.    Dispense:  0.3 mL    Refill:  1   montelukast (SINGULAIR) 10 MG tablet    Sig: Take 1 tablet (10 mg total) by mouth at bedtime.    Dispense:  30 tablet    Refill:  5   fluticasone (FLOVENT HFA) 110 MCG/ACT inhaler    Sig: Inhale 2 puffs into the lungs 2 (two) times daily.    Dispense:  1 each    Refill:  5    cefdinir (OMNICEF) 300 MG capsule    Sig: Take 1 capsule (300 mg total) by mouth 2 (two) times daily.    Dispense:  10 capsule    Refill:  0   ondansetron (ZOFRAN) 4 MG tablet    Sig: Take 1 tablet (4 mg total) by mouth every 8 (eight) hours as needed for nausea or vomiting.    Dispense:  20 tablet    Refill:  0   predniSONE (DELTASONE) 10 MG tablet    Sig: Take 2 tablets daily for 4 days, then one tablet on the 5th day.  Dispense:  9 tablet    Refill:  0   famotidine (PEPCID) 40 MG tablet    Sig: Take 1 tablet (40 mg total) by mouth daily.    Dispense:  30 tablet    Refill:  5    Patient Instructions  Asthma Prednisone 10 mg tablets. Take 2 tablets once a day for 4 days, then take 1 tablet on the 5th day, then stop.   For now and for asthma flare, begin Flovent 110-2 puffs twice a day for 2 weeks or until cough and wheeze free Continue montelukast 10 mg once a day to prevent cough or wheeze Continue albuterol 2 puffs once every 4 hours as needed for cough or wheeze You may use albuterol 2 puffs 5 to 15 minutes before activity to decrease cough or wheeze  Acute bacterial sinusitis Stop Doxycycline at this time Begin Cefdinir 300 mg twice a day for the next 5 days Begin Flonase 2 sprays in each nostril once a day for nasal congestion Begin nasal saline rinses.  Use these before medicated nasal sprays for best results Begin Mucinex 600 to 1200 mg twice a day and increase fluid intake to thin mucus.  Allergic rhinitis Continue allergen avoidance measures directed toward grass pollen, weed pollen, tree pollen, and mold as listed below Continue cetirizine 10 mg once a day as needed for runny nose or itch Continue Flonase 2 sprays in each nostril once a day as needed for a stuffy nose Consider saline nasal rinses as needed for nasal symptoms. Use this before any medicated nasal sprays for best result Continue allergen immunotherapy once you are feeling well.  Continue to have access  to an epinephrine autoinjector set  Allergic conjunctivitis Some over the counter eye drops include Pataday one drop in each eye once a day as needed for red, itchy eyes OR Zaditor one drop in each eye twice a day as needed for red itchy eyes.  Reflux Continue dietary lifestyle modifications as listed below Continue famotidine 40 mg once a day to control reflux  Vomiting Begin Zofran 4 mg once every 8 hours if needed for vomiting  Food allergy Continue to avoid rice.  Consider updating your food allergy testing if you are interested.  Remember to stop antihistamines for 3 days before your testing appointment  Call the clinic if your symptoms worsen, do not improve, or if you develop a fever  Call the clinic if this treatment plan is not working well for you.  Follow up in 2 months or sooner if needed.   Return in about 2 months (around 03/12/2022), or if symptoms worsen or fail to improve.    Thank you for the opportunity to care for this patient.  Please do not hesitate to contact me with questions.  Thermon Leyland, FNP [facility]

## 2022-01-10 NOTE — Telephone Encounter (Signed)
Error

## 2022-01-15 ENCOUNTER — Ambulatory Visit: Payer: 59 | Admitting: Allergy

## 2022-03-12 ENCOUNTER — Telehealth: Payer: 59 | Admitting: Family Medicine

## 2022-03-12 ENCOUNTER — Telehealth: Payer: Self-pay | Admitting: Family Medicine

## 2022-03-12 DIAGNOSIS — J309 Allergic rhinitis, unspecified: Secondary | ICD-10-CM

## 2022-03-12 NOTE — Telephone Encounter (Signed)
LMOM for patient to call the clinic after missed appointment today.

## 2022-03-12 NOTE — Progress Notes (Deleted)
   [address] [place] [address] Dept: [phone]  FOLLOW UP NOTE  Patient ID: Tracey Harrison, female    DOB: 06/13/2000  Age: 21 y.o. MRN: [id number] Date of Office Visit: 03/12/2022  Assessment  Chief Complaint: No chief complaint on file.  HPI Tracey Harrison is a 21 year old female who presents to the clinic for follow-up visit.  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   Drug Allergies:  Allergies  Allergen Reactions   Monosodium Glutamate Anaphylaxis   Penicillins Hives, Rash and Swelling    Other reaction(s): Weal (disorder)    Rice Hives and Other (See Comments)   Varicella Virus Vaccine Live Rash   Lactose Intolerance (Gi) Other (See Comments)    Stomach upset   Pollen Extract     Physical Exam: There were no vitals taken for this visit.   Physical Exam  Diagnostics:    Assessment and Plan: No diagnosis found.  No orders of the defined types were placed in this encounter.   There are no Patient Instructions on file for this visit.  No follow-ups on file.    Thank you for the opportunity to care for this patient.  Please do not hesitate to contact me with questions.  Thermon Leyland, FNP [facility]

## 2022-03-12 NOTE — Patient Instructions (Incomplete)
Asthma For asthma flare, begin Flovent 110-2 puffs twice a day for 2 weeks or until cough and wheeze free Continue montelukast 10 mg once a day to prevent cough or wheeze Continue albuterol 2 puffs once every 4 hours as needed for cough or wheeze You may use albuterol 2 puffs 5 to 15 minutes before activity to decrease cough or wheeze  Acute bacterial sinusitis Stop Doxycycline at this time Begin Cefdinir 300 mg twice a day for the next 5 days Begin Flonase 2 sprays in each nostril once a day for nasal congestion Begin nasal saline rinses.  Use these before medicated nasal sprays for best results Begin Mucinex 600 to 1200 mg twice a day and increase fluid intake to thin mucus.  Allergic rhinitis Continue allergen avoidance measures directed toward grass pollen, weed pollen, tree pollen, and mold as listed below Continue cetirizine 10 mg once a day as needed for runny nose or itch Continue Flonase 2 sprays in each nostril once a day as needed for a stuffy nose Consider saline nasal rinses as needed for nasal symptoms. Use this before any medicated nasal sprays for best result Continue allergen immunotherapy once you are feeling well.  Continue to have access to an epinephrine autoinjector set  Allergic conjunctivitis Some over the counter eye drops include Pataday one drop in each eye once a day as needed for red, itchy eyes OR Zaditor one drop in each eye twice a day as needed for red itchy eyes.  Reflux Continue dietary lifestyle modifications as listed below Continue famotidine 40 mg once a day to control reflux  Vomiting Begin Zofran 4 mg once every 8 hours if needed for vomiting  Food allergy Continue to avoid rice.  Consider updating your food allergy testing if you are interested.  Remember to stop antihistamines for 3 days before your testing appointment  Call the clinic if your symptoms worsen, do not improve, or if you develop a fever  Call the clinic if this treatment  plan is not working well for you.  Follow up in 2 months or sooner if needed.  Reducing Pollen Exposure The American Academy of Allergy, Asthma and Immunology suggests the following steps to reduce your exposure to pollen during allergy seasons. Do not hang sheets or clothing out to dry; pollen may collect on these items. Do not mow lawns or spend time around freshly cut grass; mowing stirs up pollen. Keep windows closed at night.  Keep car windows closed while driving. Minimize morning activities outdoors, a time when pollen counts are usually at their highest. Stay indoors as much as possible when pollen counts or humidity is high and on windy days when pollen tends to remain in the air longer. Use air conditioning when possible.  Many air conditioners have filters that trap the pollen spores. Use a HEPA room air filter to remove pollen form the indoor air you breathe.  Control of Mold Allergen Mold and fungi can grow on a variety of surfaces provided certain temperature and moisture conditions exist.  Outdoor molds grow on plants, decaying vegetation and soil.  The major outdoor mold, Alternaria and Cladosporium, are found in very high numbers during hot and dry conditions.  Generally, a late Summer - Fall peak is seen for common outdoor fungal spores.  Rain will temporarily lower outdoor mold spore count, but counts rise rapidly when the rainy period ends.  The most important indoor molds are Aspergillus and Penicillium.  Dark, humid and poorly ventilated basements are  ideal sites for mold growth.  The next most common sites of mold growth are the bathroom and the kitchen.  Outdoor [employer] Use air conditioning and keep windows closed Avoid exposure to decaying vegetation. Avoid leaf raking. Avoid grain handling. Consider wearing a face mask if working in moldy areas.  Indoor Mold Control Maintain humidity below 50%. Clean washable surfaces with 5% bleach solution. Remove sources  e.g. Contaminated carpets.

## 2023-03-06 IMAGING — CT CT ABD-PELV W/ CM
2 of 4 series · 17 of 46 positions shown, 19 images · IV contrast (APPLIED)
Comparison: None.

CLINICAL DATA: Right lower quadrant abdominal pain and vomiting.

EXAM:
CT ABDOMEN AND PELVIS WITH CONTRAST
TECHNIQUE: Multidetector CT imaging of the abdomen and pelvis was performed
using the standard protocol following bolus administration of
intravenous contrast.

[Series 3: abd/ pelvis 5.0 i30f 2 · axial · 0.83mm/px · z∈[+785,+1220]mm · 14 of 95 slices shown, 16 images]
[im 4/95  soft-tissue]
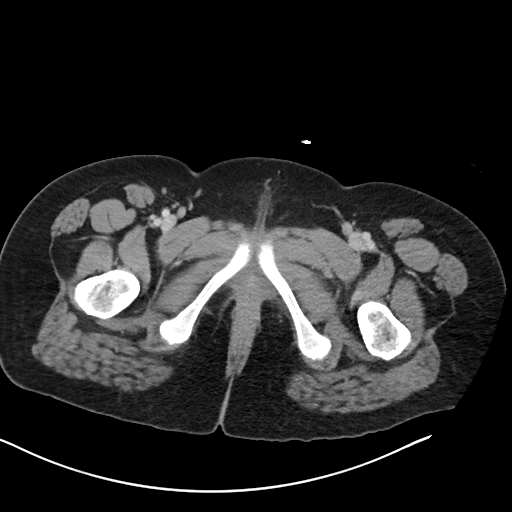
[im 4/95  bone]
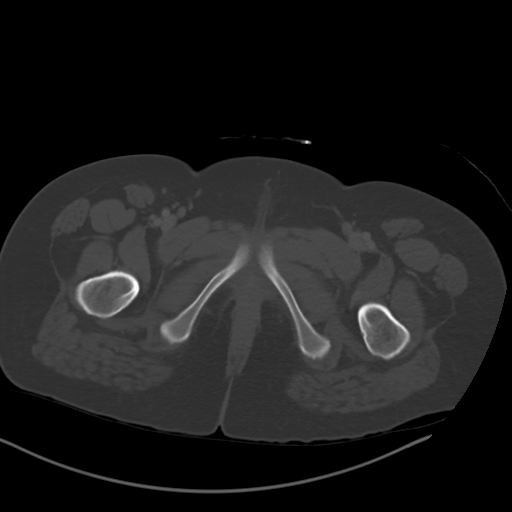
[im 12/95  soft-tissue]
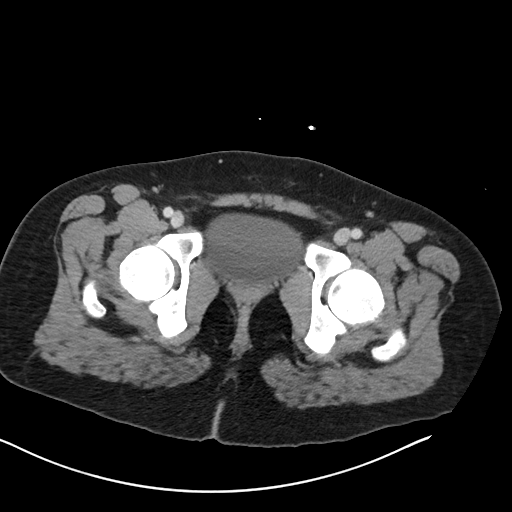
[im 19/95  soft-tissue]
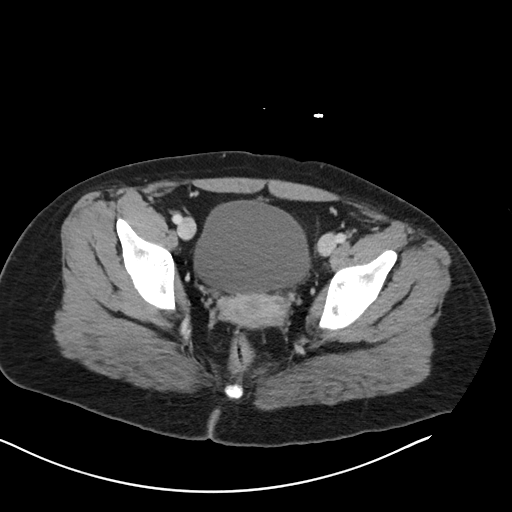
[im 27/95  soft-tissue]
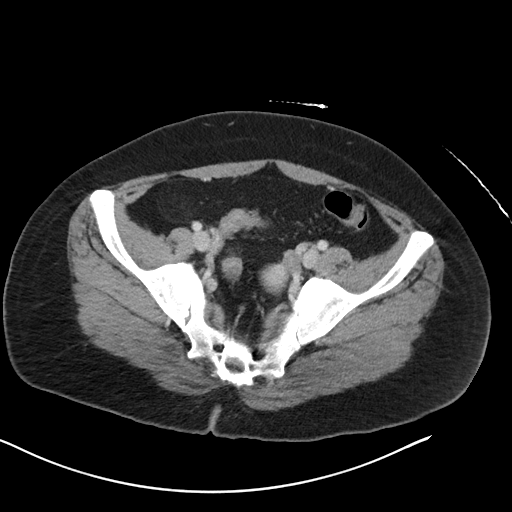
[im 31/95  soft-tissue]
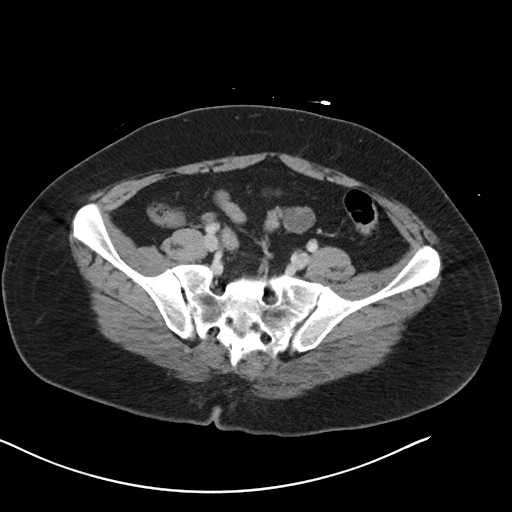
[im 38/95  soft-tissue]
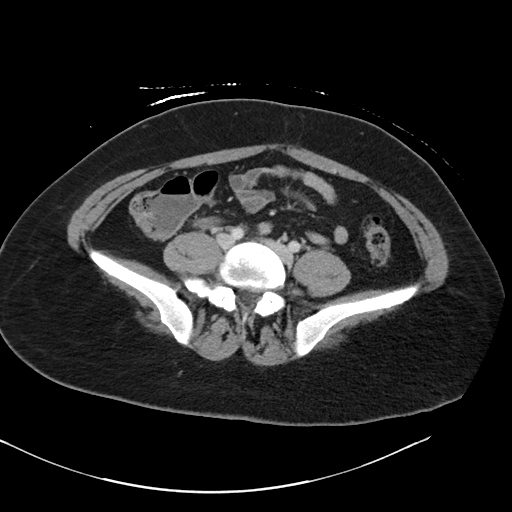
[im 46/95  soft-tissue]
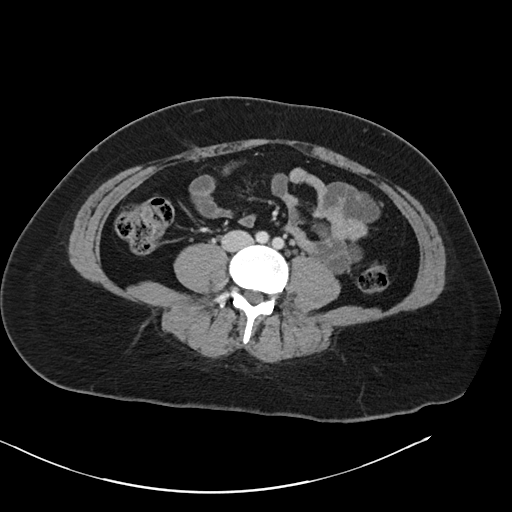
[im 49/95  soft-tissue]
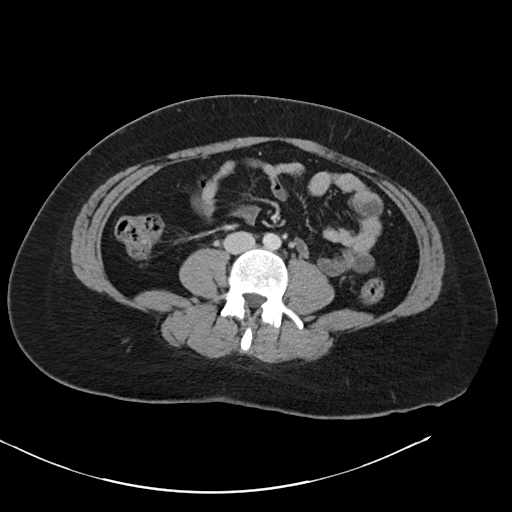
[im 57/95  soft-tissue]
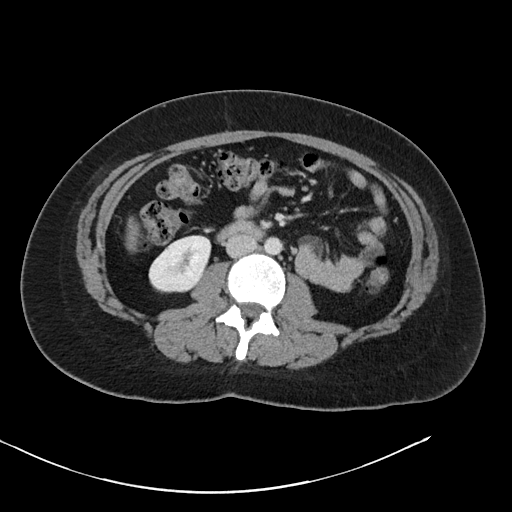
[im 57/95  bone]
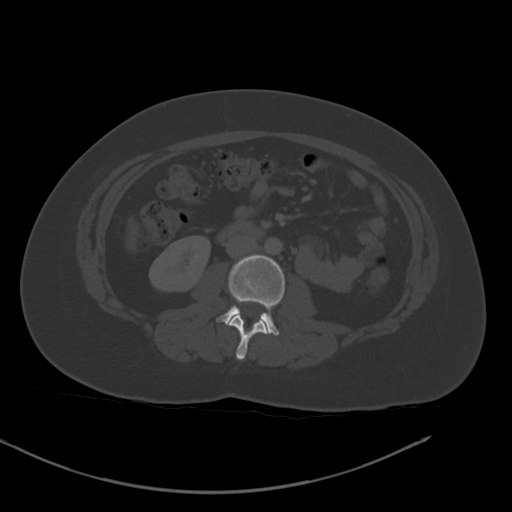
[im 64/95  soft-tissue]
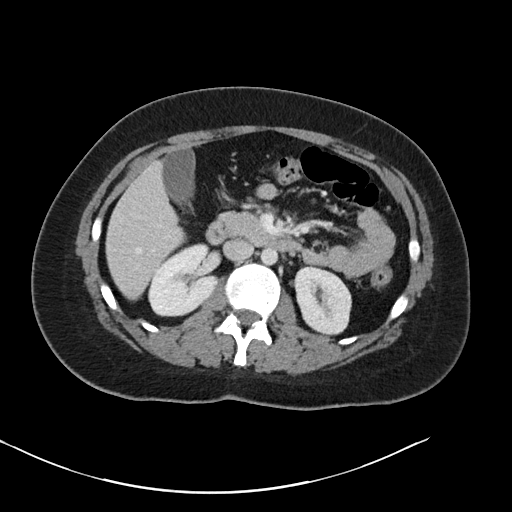
[im 72/95  soft-tissue]
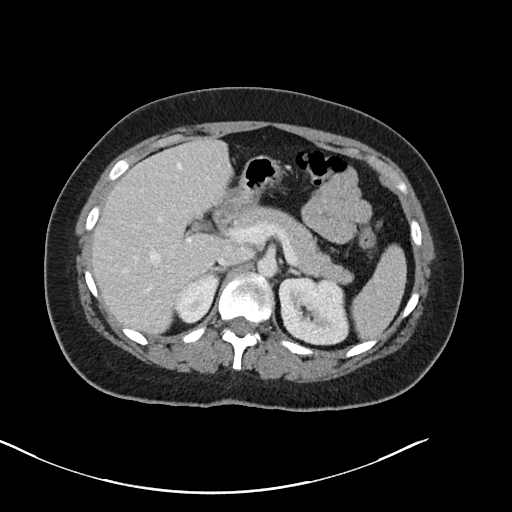
[im 76/95  soft-tissue]
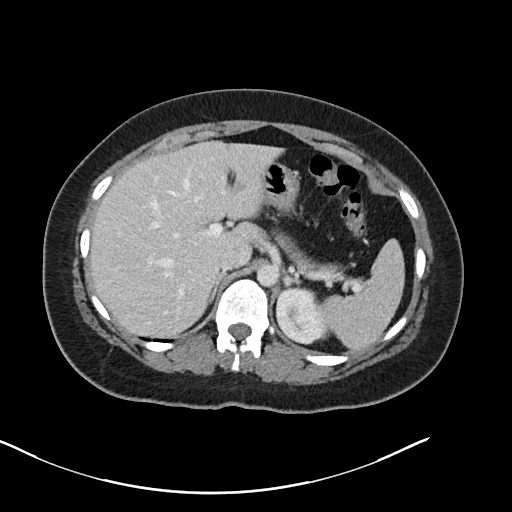
[im 83/95  soft-tissue]
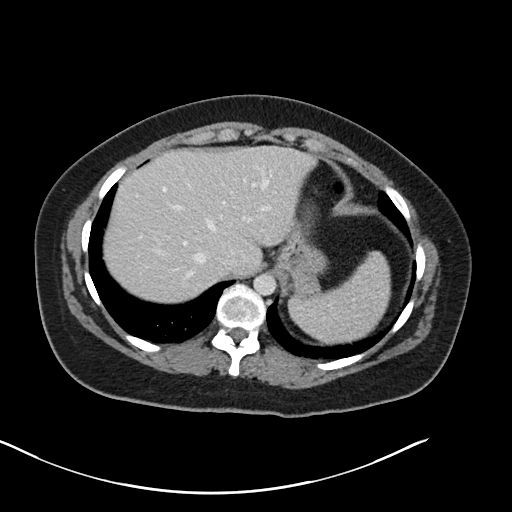
[im 91/95  soft-tissue]
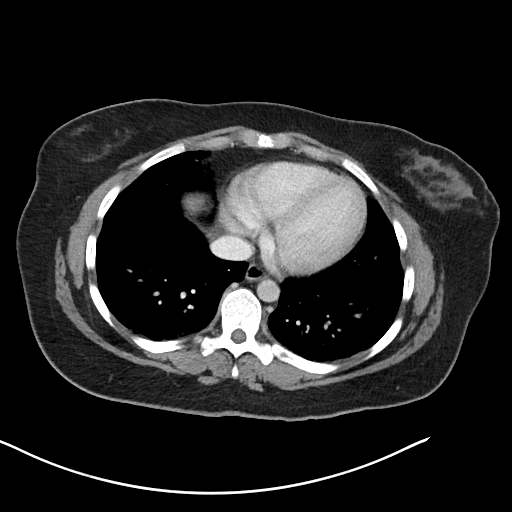

[Series 6: coronal soft tissue · coronal · 0.91mm/px · 3 of 101 slices shown]
[im 34/101  soft-tissue]
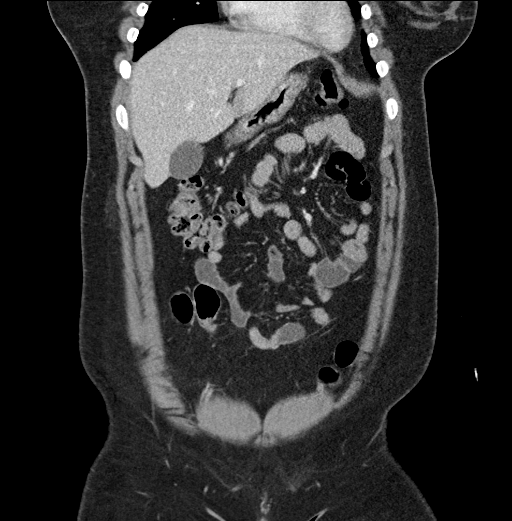
[im 45/101  soft-tissue]
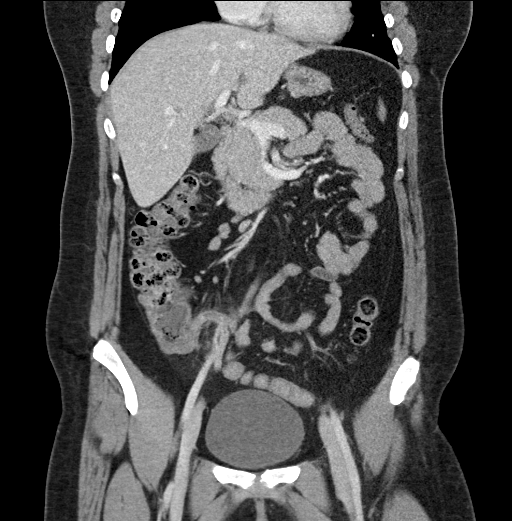
[im 56/101  soft-tissue]
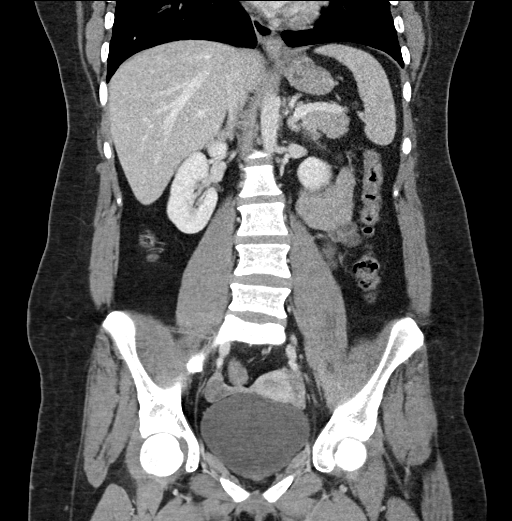

[17 of 46 positions shown; findings below may reference images not displayed]

RADIATION DOSE REDUCTION: This exam was performed according to the
departmental dose-optimization program which includes automated
exposure control, adjustment of the mA and/or kV according to
patient size and/or use of iterative reconstruction technique.

CONTRAST:  100mL OMNIPAQUE IOHEXOL 300 MG/ML  SOLN
FINDINGS: Lower chest: No acute abnormality.

Hepatobiliary: No focal liver abnormality is seen. No gallstones,
gallbladder wall thickening, or biliary dilatation.

Pancreas: Unremarkable. No pancreatic ductal dilatation or
surrounding inflammatory changes.

Spleen: Normal in size without focal abnormality.

Adrenals/Urinary Tract: Adrenal glands are unremarkable. Kidneys are
normal, without renal calculi, focal lesion, or hydronephrosis.
Bladder is unremarkable.

Stomach/Bowel: Fluid-filled, mildly dilated appendix in the right
lower quadrant measuring 9 mm in diameter. Suspected tiny
appendicoliths at the base (series 6, images 44 and 45). Mild
mucosal hyperenhancement and trace periappendiceal fat stranding. No
extraluminal air or peroneal appendiceal fluid collection.

The stomach, small bowel, and colon are unremarkable.

Vascular/Lymphatic: No significant vascular findings are present. No
enlarged abdominal or pelvic lymph nodes.

Reproductive: Uterus and bilateral adnexa are unremarkable.

Other: No abdominal wall hernia or abnormality. No abdominopelvic
ascites. No pneumoperitoneum.

Musculoskeletal: No acute or significant osseous findings.
IMPRESSION: 1. Early acute uncomplicated appendicitis.
